# Patient Record
Sex: Female | Born: 1975
Health system: Southern US, Community
[De-identification: ages and names within clinical notes are randomized; demographics above are authoritative.]

## PROBLEM LIST (undated history)

## (undated) DIAGNOSIS — I214 Non-ST elevation (NSTEMI) myocardial infarction: Secondary | ICD-10-CM

## (undated) DIAGNOSIS — F419 Anxiety disorder, unspecified: Secondary | ICD-10-CM

## (undated) DIAGNOSIS — E785 Hyperlipidemia, unspecified: Secondary | ICD-10-CM

## (undated) DIAGNOSIS — I2510 Atherosclerotic heart disease of native coronary artery without angina pectoris: Secondary | ICD-10-CM

## (undated) DIAGNOSIS — T7840XA Allergy, unspecified, initial encounter: Secondary | ICD-10-CM

## (undated) DIAGNOSIS — F32A Depression, unspecified: Secondary | ICD-10-CM

## (undated) DIAGNOSIS — D751 Secondary polycythemia: Secondary | ICD-10-CM

## (undated) DIAGNOSIS — Z720 Tobacco use: Secondary | ICD-10-CM

## (undated) HISTORY — DX: Depression, unspecified: F32.A

## (undated) HISTORY — DX: Hyperlipidemia, unspecified: E78.5

## (undated) HISTORY — DX: Non-ST elevation (NSTEMI) myocardial infarction: I21.4

## (undated) HISTORY — DX: Anxiety disorder, unspecified: F41.9

## (undated) HISTORY — PX: ABDOMINAL HYSTERECTOMY: SHX81

## (undated) HISTORY — DX: Atherosclerotic heart disease of native coronary artery without angina pectoris: I25.10

## (undated) HISTORY — PX: BOTOX INJECTION: SHX5754

## (undated) HISTORY — DX: Allergy, unspecified, initial encounter: T78.40XA

## (undated) HISTORY — PX: OTHER SURGICAL HISTORY: SHX169

---

## 2004-10-24 ENCOUNTER — Emergency Department: Payer: Self-pay | Admitting: Emergency Medicine

## 2004-10-25 ENCOUNTER — Ambulatory Visit: Payer: Self-pay | Admitting: Emergency Medicine

## 2005-04-29 ENCOUNTER — Inpatient Hospital Stay: Payer: Self-pay | Admitting: Obstetrics and Gynecology

## 2005-05-11 ENCOUNTER — Ambulatory Visit: Payer: Self-pay | Admitting: Obstetrics and Gynecology

## 2010-06-03 ENCOUNTER — Ambulatory Visit: Payer: Self-pay

## 2010-06-28 ENCOUNTER — Ambulatory Visit: Payer: Self-pay | Admitting: Obstetrics & Gynecology

## 2010-07-06 ENCOUNTER — Ambulatory Visit: Payer: Self-pay | Admitting: Obstetrics & Gynecology

## 2011-07-21 ENCOUNTER — Ambulatory Visit: Payer: Self-pay

## 2012-09-19 ENCOUNTER — Emergency Department: Payer: Self-pay | Admitting: Emergency Medicine

## 2012-09-19 LAB — COMPREHENSIVE METABOLIC PANEL
Albumin: 4.2 g/dL (ref 3.4–5.0)
BUN: 10 mg/dL (ref 7–18)
Bilirubin,Total: 0.2 mg/dL (ref 0.2–1.0)
Calcium, Total: 8.7 mg/dL (ref 8.5–10.1)
Chloride: 104 mmol/L (ref 98–107)
Creatinine: 0.5 mg/dL — ABNORMAL LOW (ref 0.60–1.30)
EGFR (Non-African Amer.): >60
Glucose: 89 mg/dL (ref 65–99)
Potassium: 3.9 mmol/L (ref 3.5–5.1)
SGOT(AST): 92 U/L — ABNORMAL HIGH (ref 15–37)
SGPT (ALT): 74 U/L (ref 12–78)
Sodium: 133 mmol/L — ABNORMAL LOW (ref 136–145)
Total Protein: 8 g/dL (ref 6.4–8.2)

## 2012-09-19 LAB — URINALYSIS, COMPLETE
Bacteria: NONE SEEN
Blood: NEGATIVE
Glucose,UR: NEGATIVE mg/dL (ref 0–75)
Leukocyte Esterase: NEGATIVE
Nitrite: NEGATIVE
RBC,UR: NONE SEEN /HPF (ref 0–5)
Specific Gravity: 1.003 (ref 1.003–1.030)
Squamous Epithelial: NONE SEEN
WBC UR: <1 /HPF (ref 0–5)

## 2012-09-19 LAB — DRUG SCREEN, URINE
Amphetamines, Ur Screen: NEGATIVE (ref ?–1000)
Benzodiazepine, Ur Scrn: NEGATIVE (ref ?–200)
Cannabinoid 50 Ng, Ur ~~LOC~~: NEGATIVE (ref ?–50)
Cocaine Metabolite,Ur ~~LOC~~: NEGATIVE (ref ?–300)
MDMA (Ecstasy)Ur Screen: NEGATIVE (ref ?–500)
Methadone, Ur Screen: NEGATIVE (ref ?–300)
Opiate, Ur Screen: NEGATIVE (ref ?–300)
Tricyclic, Ur Screen: NEGATIVE (ref ?–1000)

## 2012-09-19 LAB — CBC
HCT: 45.5 % (ref 35.0–47.0)
HGB: 15.8 g/dL (ref 12.0–16.0)
MCH: 33.3 pg (ref 26.0–34.0)
Platelet: 292 10*3/uL (ref 150–440)
RDW: 12.6 % (ref 11.5–14.5)

## 2012-09-19 LAB — ETHANOL
Ethanol %: 0.318 % (ref 0.000–0.080)
Ethanol: 318 mg/dL

## 2012-09-19 LAB — PREGNANCY, URINE: Pregnancy Test, Urine: NEGATIVE m[IU]/mL

## 2012-09-20 LAB — ETHANOL: Ethanol: <3 mg/dL

## 2013-11-26 IMAGING — CR DG CHEST 2V
1 series · 2 of 2 positions shown · non-contrast
Comparison: none

REASON FOR EXAM: Rib pain left side, cough and chest discomfort
COMMENTS:

PROCEDURE:     MDR - MDR CHEST PA(OR AP) AND LATERAL  - July 21, 2011 [DATE]
RESULT:     The lung fields are clear. The heart, mediastinal and osseous
structures show no significant abnormalities. Particular attention to the
left ribs shows no definite rib fracture.

[Series 1: pa · 0.17mm/px · 2 of 2 slices shown]
[im 1/2]
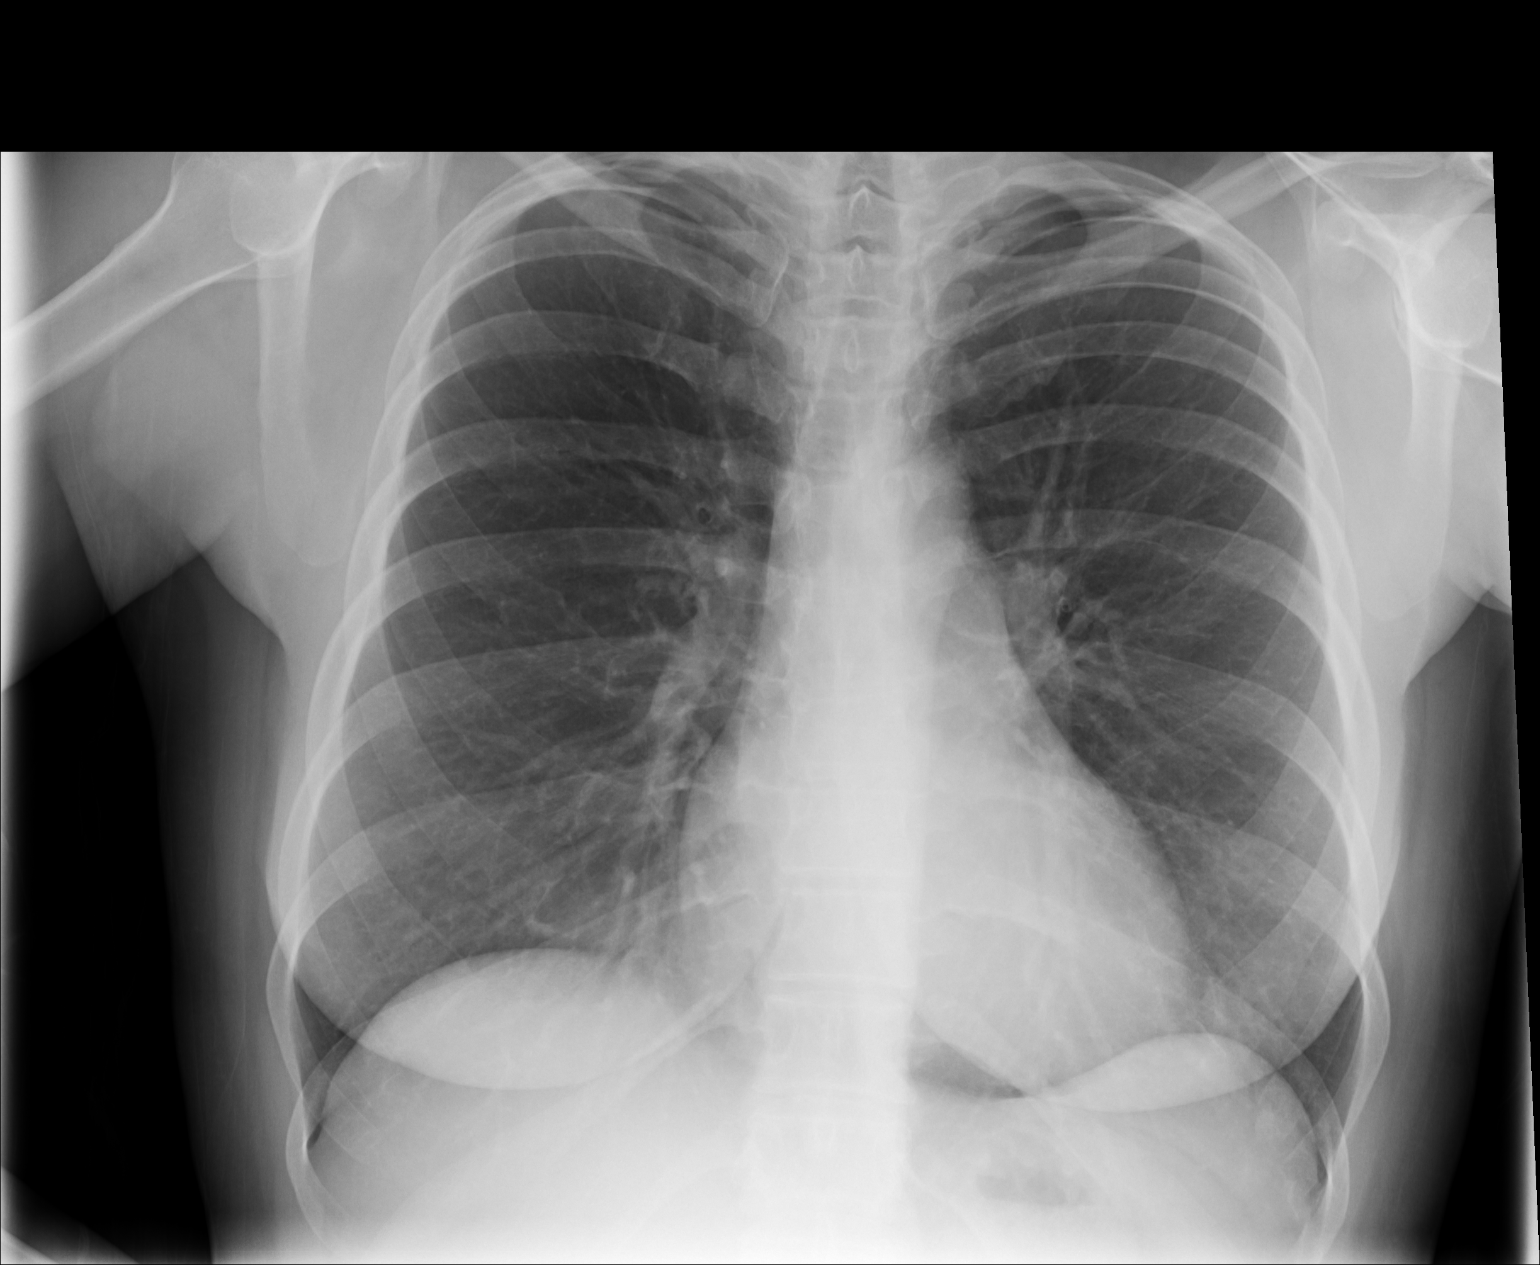
[im 2/2]
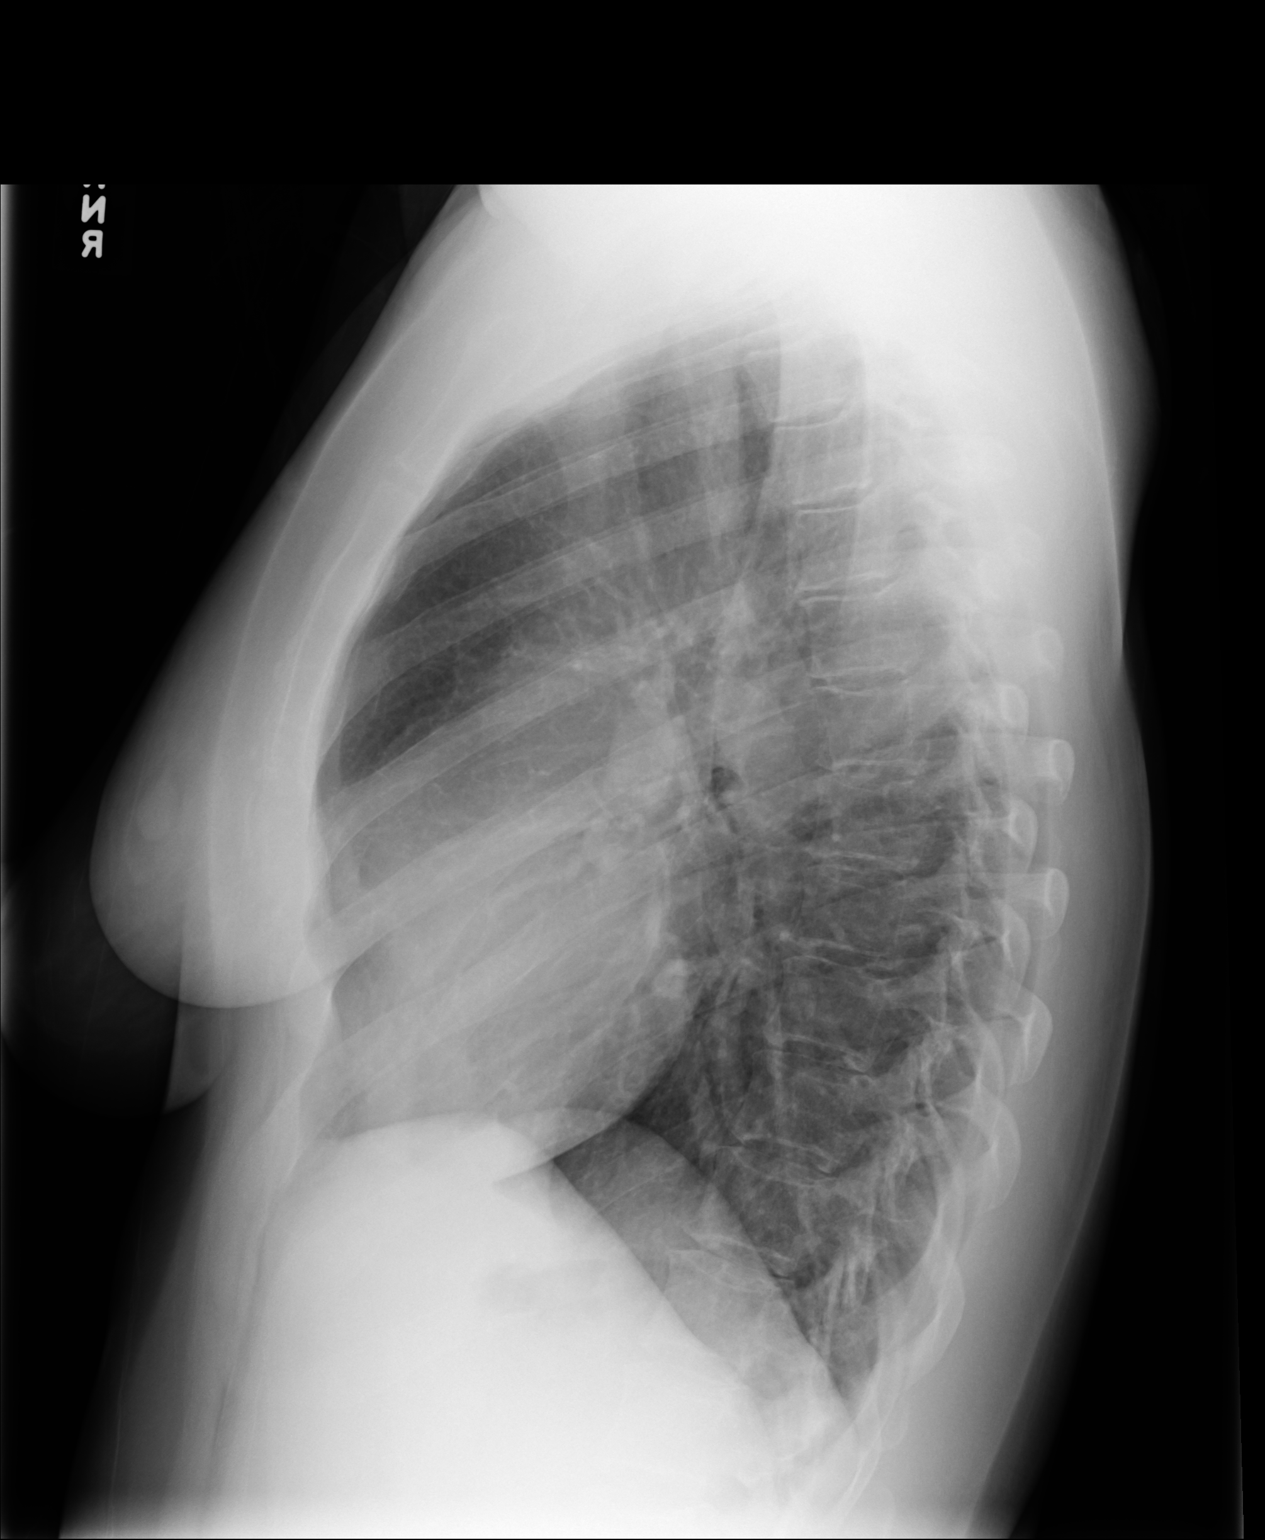

[2 of 2 positions shown; findings below may reference images not displayed]

IMPRESSION: 1.     No acute changes are identified.

## 2014-09-12 NOTE — Consult Note (Signed)
PATIENT NAME:  Anna Cameron, ORD MR#:  [id number] DATE OF BIRTH:  Apr 08, 1976  DATE OF CONSULTATION:  09/20/2012  REFERRING PHYSICIAN: Doralee Albino. Ladona Ridgel, MD  CONSULTING PHYSICIAN:  Ardeen Fillers. Garnetta Buddy, MD  REASON FOR CONSULTATION: "I'm tired of it."   HISTORY OF PRESENT ILLNESS: The patient is a 39 year old single Caucasian female who currently lives with her boyfriend, who presented to the ED requesting detox. She stated that she has a long history of drinking alcohol for the past several years. She stated that she has been consuming approximately 12-pack per day. Reported that she has been drinking steadily for the past 1 year. She came to the Emergency Room yesterday. She currently lives with her boyfriend and 2 children, ages 83 and 9 years old. She stated that she has been tired of drinking, and it has been helping her for a while, but now the stressors are becoming heavy at home as both she and her boyfriend have been drinking consistently. She reported that she does not have any history of withdrawal symptoms, and she has never been to a detox program. She decided to seek help at this time. The patient reported that she is not feeling depressed or anxious and does not have any suicidal ideations or plans. She reported that she wants to get help for her alcohol use at this time. She reported that after she consumes alcohol she can function well, and even before coming here, she made spaghetti for her children. She reported that she has been using 20 mg of Prozac to help with "sadness" on a daily basis. She reported that she has been sleeping well and does not have any problems with her appetite. The patient appeared motivated at this time.   PAST PSYCHIATRIC HISTORY: The patient reported that she has never been admitted to a psychiatric hospital. She denied any history of previous suicide attempts. Reported that she only takes Prozac for her depressive symptoms. She denied having any perceptual disturbances.    SUBSTANCE ABUSE HISTORY: The patient has a long history of using alcohol. Reported that she was using alcohol off and on for the past 2 years, but in the past 1 year, she started using it on a steady basis. She uses approximately 12-pack on a daily basis. Her current alcohol level is more than 300. She currently denies using other illicit drugs, but smokes cigarettes 1-1/2 packs per day.   FAMILY HISTORY: The patient denied any family history of use of drugs or alcohol.   PAST MEDICAL HISTORY:  1. Partial hysterectomy.  2. History of bladder surgery.  3. History of previous cesarean sections.   ALLERGIES: No known drug allergies.   CURRENT MEDICATIONS: Prozac 20 mg daily.   SOCIAL HISTORY: The patient reported that she currently lives with her boyfriend for the past 2 years. She has 2 children, ages 31 and 15 years old. 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 She denied any pending legal charges.   ANCILLARY DATA: Laboratory data: Glucose 89, BUN 10, creatinine 0.50, sodium 133, potassium 3.9, chloride 104, bicarbonate 18, GFR 60, anion gap 11, osmolality 265, calcium 8.7. Blood alcohol level 318. Protein 8.0, albumin 4.2, bilirubin 0.2, alkaline phosphatase 125, AST 92, ALT 74. Urine drug screen was negative. WBC 18.9, RBC 4.75, hemoglobin 15.8, hematocrit 45.5, platelet count 292, MCV 96, MCH 32.3, RDW 12.6. Salicylate level 14.6. Pregnancy test negative.   VITAL SIGNS: Temperature 98.3, pulse 70, respirations 18, blood pressure 120/62.   REVIEW OF SYSTEMS:  CONSTITUTIONAL: Denies having any fever or chills. No weight changes.  EYES: No double or blurred vision.  RESPIRATORY: No shortness of breath or cough.  CARDIOVASCULAR: No chest pain or orthopnea.  GASTROINTESTINAL: No abdominal pain, nausea, vomiting or diarrhea.  GENITOURINARY: No incontinence or frequency.  ENDOCRINE: No heat or cold intolerance.  LYMPHATIC: No anemia or easy bruising.  INTEGUMENTARY: No acne or rash.   MUSCULOSKELETAL: Denies having any muscle or joint pain.  NEUROLOGIC: No tingling or weakness noted.   MENTAL STATUS EXAMINATION: The patient is a moderately built female who appeared calm and cooperative. She maintained fair eye contact. Her mood was fine. Affect was congruent. Thought process was logical and goal-directed. Thought content was non-delusional. She currently denied having any suicidal ideation or plan. No impulsive behavior was noted. She was minimizing her use of alcohol at this time. She denied having any withdrawal symptoms.   DIAGNOSTIC IMPRESSION:  AXIS I:  1. Alcohol dependence.  2. Alcohol-induced mood disorder.  AXIS II: None.  AXIS III: Please review the medical history.   TREATMENT PLAN: I discussed with the patient at length about the alcohol rehabilitation programs, and she agreed to go to the [facility], which will provide her with 3 days at least of inpatient rehabilitation, and then outpatient rehab.[facility]. She will continue on her Prozac 20 mg daily at this time. The patient will be discharged in stable condition. She denied having any suicidal or homicidal ideations or plans. No new medications will be prescribed at this time.   Thank you for allowing me to participate in the care of this patient.   ____________________________ Ardeen FillersUzma S. Garnetta BuddyFaheem, MD usf:OSi D: 09/20/2012 12:57:44 ET T: 09/20/2012 13:25:28 ET JOB#: [id number]  cc: Ardeen FillersUzma S. Garnetta BuddyFaheem, MD, <Dictator> Rhunette CroftUZMA S Huzaifa Viney MD ELECTRONICALLY SIGNED 09/28/2012 8:59

## 2016-09-22 DIAGNOSIS — R634 Abnormal weight loss: Secondary | ICD-10-CM | POA: Insufficient documentation

## 2019-02-06 DIAGNOSIS — N3281 Overactive bladder: Secondary | ICD-10-CM | POA: Diagnosis not present

## 2019-02-06 DIAGNOSIS — N3941 Urge incontinence: Secondary | ICD-10-CM | POA: Diagnosis not present

## 2019-02-06 DIAGNOSIS — R3915 Urgency of urination: Secondary | ICD-10-CM | POA: Diagnosis not present

## 2019-03-06 DIAGNOSIS — N3281 Overactive bladder: Secondary | ICD-10-CM | POA: Diagnosis not present

## 2019-03-06 DIAGNOSIS — R3915 Urgency of urination: Secondary | ICD-10-CM | POA: Diagnosis not present

## 2019-03-06 DIAGNOSIS — N3941 Urge incontinence: Secondary | ICD-10-CM | POA: Diagnosis not present

## 2019-05-11 DIAGNOSIS — Z20828 Contact with and (suspected) exposure to other viral communicable diseases: Secondary | ICD-10-CM | POA: Diagnosis not present

## 2019-06-07 DIAGNOSIS — Z20828 Contact with and (suspected) exposure to other viral communicable diseases: Secondary | ICD-10-CM | POA: Diagnosis not present

## 2019-06-25 DIAGNOSIS — Z20828 Contact with and (suspected) exposure to other viral communicable diseases: Secondary | ICD-10-CM | POA: Diagnosis not present

## 2019-07-03 DIAGNOSIS — N3281 Overactive bladder: Secondary | ICD-10-CM | POA: Diagnosis not present

## 2019-07-03 DIAGNOSIS — N399 Disorder of urinary system, unspecified: Secondary | ICD-10-CM | POA: Diagnosis not present

## 2019-08-26 ENCOUNTER — Encounter
Admission: RE | Admit: 2019-08-26 | Discharge: 2019-08-26 | Disposition: A | Discharge status: Home / Self Care | Payer: 59 | Source: Ambulatory Visit | Attending: Urology | Admitting: Urology

## 2019-08-26 ENCOUNTER — Other Ambulatory Visit: Payer: Self-pay

## 2019-08-26 NOTE — Patient Instructions (Signed)
  Your procedure is scheduled on: Thursday September 05, 2019 Report to Day Surgery. To find out your arrival time please call [phone] between 1PM - 3PM on Wednesday September 04, 2019.  Remember: Instructions that are not followed completely may result in serious medical risk,  up to and including death, or upon the discretion of your surgeon and anesthesiologist your  surgery may need to be rescheduled.     _X__ 1. Do not eat food after midnight the night before your procedure.                 No gum chewing or hard candies. You may drink clear liquids up to 2 hours                 before you are scheduled to arrive for your surgery- DO not drink clear                 liquids within 2 hours of the start of your surgery.                 Clear Liquids include:  water, apple juice without pulp, clear Gatorade, G2 or                  Gatorade Zero (avoid Red/Purple/Blue), Black Coffee or Tea (Do not add                 anything to coffee or tea).  __X__2.  On the morning of surgery brush your teeth with toothpaste and water, you                may rinse your mouth with mouthwash if you wish.  Do not swallow any toothpaste of mouthwash.     _X__ 3.  No Alcohol for 24 hours before or after surgery.   _X__ 4.  Do Not Smoke or use e-cigarettes For 24 Hours Prior to Your Surgery.                 Do not use any chewable tobacco products for at least 6 hours prior to                 surgery.   __x__  5.  Notify your doctor if there is any change in your medical condition      (cold, fever, infections).     Do not wear jewelry, make-up, hairpins, clips or nail polish. Do not wear lotions, powders, or perfumes. You may wear deodorant. Do not shave 48 hours prior to surgery. Men may shave face and neck. Do not bring valuables to the hospital.    [facility] is not responsible for any belongings or valuables.  Contacts, dentures or bridgework may not be worn into  surgery. Leave your suitcase in the car. After surgery it may be brought to your room. For patients admitted to the hospital, discharge time is determined by your treatment team.   Patients discharged the day of surgery will not be allowed to drive home.   Make arrangements for someone to be with you for the first 24 hours of your Same Day Discharge.  ____ Take these medicines the morning of surgery with A SIP OF WATER:    1.none   __x__ Stop Anti-inflammatories on ibuprofen, Aleve, naproxen, aspirin and or BC powders.    __x__ Stop supplements until after surgery.    __x__ Do not start any herbal supplements before your surgery.

## 2019-08-30 NOTE — H&P (Signed)
NAME: Anna Cameron, Anna Cameron MEDICAL RECORD YT:[id number] ACCOUNT [id number] DATE OF BIRTH:10/16/1975 FACILITY: [facility] LOCATION:  PHYSICIAN:Faria Casella Gilles Chiquito, MD  HISTORY AND PHYSICAL  DATE OF ADMISSION:  09/05/2019  CHIEF COMPLAINT:  Urinary urgency and urge incontinence.  HISTORY OF PRESENT ILLNESS:  The patient is a 44 year old white female with overactive bladder and urge incontinence.  She failed treatment with Myrbetriq 50 mg and Toviaz 8 mg.  Overactive bladder symptom score was 30.  She comes in now for bladder  Botox injection.  ALLERGIES:  No drug allergies.  CURRENT MEDICATIONS:  Include multivitamins and vitamin B12.  PAST SURGICAL HISTORY:  C-sections, 2005, 2006 and 1998.  She had a partial hysterectomy 2008.  She had an anterior vaginal repair in 2002.  PAST AND CURRENT MEDICAL CONDITIONS:  Negative.  REVIEW OF SYSTEMS:  The patient has a history of tension headaches.  She denies chest pain, heart disease, shortness of breath, diabetes, stroke or hypertension.  SOCIAL HISTORY:  The patient smokes a pack and a half a day and has a 30-pack-year history.  She denied alcohol use.  FAMILY HISTORY:  Father died at age 2 of pancreatic cancer.  Mother is living, age 67 without any significant health problems.  PHYSICAL EXAMINATION: VITAL SIGNS:  Height 5 feet 6 inches, weight 170, BMI 27. GENERAL:  Well-nourished, white female in no acute distress. HEENT:  Sclerae were clear.  Pupils are equally round, reactive to light and accommodation.  Extraocular movements are intact. NECK:  No palpable masses or tenderness.  Thyroid gland was smooth without palpable nodules.  No audible carotid bruits. LYMPHATIC:  No palpable inguinal or cervical adenopathy. PULMONARY:  Lungs clear to auscultation. CARDIOVASCULAR:  Regular rhythm and rate without audible murmurs. ABDOMEN:  Soft, nontender abdomen.  No CVA tenderness. GENITOURINARY:  Revealed normal external genitalia.  She had good  anterior support with negative Marshall test.  Urethral meatus was normal.  No vaginal discharge. RECTAL:  No palpable rectal masses.  Good anal sphincter tone. NEUROMUSCULAR:  Grossly intact.  Alert and oriented x3.  IMPRESSION:  Overactive bladder with urge incontinence.  PLAN:  Bladder Botox injections.  JN/NUANCE  D:08/30/2019 T:08/30/2019 JOB:[id number]

## 2019-09-03 ENCOUNTER — Other Ambulatory Visit: Payer: Self-pay

## 2019-09-03 ENCOUNTER — Other Ambulatory Visit
Admission: RE | Admit: 2019-09-03 | Discharge: 2019-09-03 | Disposition: A | Discharge status: Home / Self Care | Payer: 59 | Source: Ambulatory Visit | Attending: Urology | Admitting: Urology

## 2019-09-03 DIAGNOSIS — Z20822 Contact with and (suspected) exposure to covid-19: Secondary | ICD-10-CM | POA: Diagnosis not present

## 2019-09-03 DIAGNOSIS — Z01812 Encounter for preprocedural laboratory examination: Secondary | ICD-10-CM | POA: Insufficient documentation

## 2019-09-03 LAB — SARS CORONAVIRUS 2 (TAT 6-24 HRS): SARS Coronavirus 2: NEGATIVE

## 2019-09-05 ENCOUNTER — Ambulatory Visit: Payer: 59 | Admitting: Anesthesiology

## 2019-09-05 ENCOUNTER — Ambulatory Visit
Admission: RE | Admit: 2019-09-05 | Discharge: 2019-09-05 | Disposition: A | Discharge status: Home / Self Care | Payer: 59 | Source: Ambulatory Visit | Attending: Urology | Admitting: Urology

## 2019-09-05 ENCOUNTER — Encounter
Admission: RE | Disposition: A | Discharge status: Home / Self Care | Payer: Self-pay | Source: Ambulatory Visit | Attending: Urology

## 2019-09-05 ENCOUNTER — Other Ambulatory Visit: Payer: Self-pay

## 2019-09-05 ENCOUNTER — Encounter: Payer: Self-pay | Admitting: Urology

## 2019-09-05 DIAGNOSIS — N3281 Overactive bladder: Secondary | ICD-10-CM | POA: Insufficient documentation

## 2019-09-05 DIAGNOSIS — N3941 Urge incontinence: Secondary | ICD-10-CM | POA: Diagnosis not present

## 2019-09-05 DIAGNOSIS — F17210 Nicotine dependence, cigarettes, uncomplicated: Secondary | ICD-10-CM | POA: Diagnosis not present

## 2019-09-05 HISTORY — PX: CYSTOSCOPY WITH INJECTION: SHX1424

## 2019-09-05 SURGERY — CYSTOSCOPY, WITH INJECTION OF BLADDER NECK OR BLADDER WALL
Anesthesia: General

## 2019-09-05 MED ORDER — FAMOTIDINE 20 MG PO TABS
ORAL_TABLET | ORAL | Status: AC
  Administered 2019-09-05: 20 mg via ORAL
  Filled 2019-09-05: qty 1

## 2019-09-05 MED ORDER — BELLADONNA ALKALOIDS-OPIUM 16.2-60 MG RE SUPP
RECTAL | Status: AC
  Filled 2019-09-05: qty 1

## 2019-09-05 MED ORDER — FENTANYL CITRATE (PF) 100 MCG/2ML IJ SOLN: 25.0000 ug | INTRAMUSCULAR | Status: DC | PRN

## 2019-09-05 MED ORDER — FAMOTIDINE 20 MG PO TABS: 20.0000 mg | ORAL_TABLET | Freq: Once | ORAL | Status: AC

## 2019-09-05 MED ORDER — CIPROFLOXACIN HCL 500 MG PO TABS: 500.0000 mg | ORAL_TABLET | Freq: Two times a day (BID) | ORAL | 0 refills | Status: DC

## 2019-09-05 MED ORDER — LACTATED RINGERS IV SOLN: INTRAVENOUS | Status: DC

## 2019-09-05 MED ORDER — LIDOCAINE HCL URETHRAL/MUCOSAL 2 % EX GEL
CUTANEOUS | Status: AC
  Filled 2019-09-05: qty 10

## 2019-09-05 MED ORDER — ONDANSETRON HCL 4 MG/2ML IJ SOLN: 4.0000 mg | Freq: Once | INTRAMUSCULAR | Status: DC | PRN

## 2019-09-05 MED ORDER — PROPOFOL 10 MG/ML IV BOLUS
INTRAVENOUS | Status: AC
  Filled 2019-09-05: qty 20

## 2019-09-05 MED ORDER — CEFAZOLIN SODIUM-DEXTROSE 1-4 GM/50ML-% IV SOLN
INTRAVENOUS | Status: AC
  Filled 2019-09-05: qty 50

## 2019-09-05 MED ORDER — MIDAZOLAM HCL 2 MG/2ML IJ SOLN
INTRAMUSCULAR | Status: DC | PRN
  Administered 2019-09-05: 2 mg via INTRAVENOUS

## 2019-09-05 MED ORDER — URO-MP 118 MG PO CAPS: 1.0000 | ORAL_CAPSULE | Freq: Four times a day (QID) | ORAL | 1 refills | Status: DC | PRN

## 2019-09-05 MED ORDER — FENTANYL CITRATE (PF) 100 MCG/2ML IJ SOLN
INTRAMUSCULAR | Status: DC | PRN
  Administered 2019-09-05 (×2): 50 ug via INTRAVENOUS

## 2019-09-05 MED ORDER — CEFAZOLIN SODIUM-DEXTROSE 1-4 GM/50ML-% IV SOLN
1.0000 g | Freq: Once | INTRAVENOUS | Status: AC
  Administered 2019-09-05: 08:00:00 1 g via INTRAVENOUS

## 2019-09-05 MED ORDER — MIDAZOLAM HCL 2 MG/2ML IJ SOLN
INTRAMUSCULAR | Status: AC
  Filled 2019-09-05: qty 2

## 2019-09-05 MED ORDER — OXYCODONE HCL 5 MG PO TABS: 5.0000 mg | ORAL_TABLET | Freq: Once | ORAL | Status: DC | PRN

## 2019-09-05 MED ORDER — PROPOFOL 500 MG/50ML IV EMUL
INTRAVENOUS | Status: DC | PRN
  Administered 2019-09-05: 100 ug/kg/min via INTRAVENOUS

## 2019-09-05 MED ORDER — SODIUM CHLORIDE (PF) 0.9 % IJ SOLN
INTRAMUSCULAR | Status: AC
  Filled 2019-09-05: qty 50

## 2019-09-05 MED ORDER — ACETAMINOPHEN 10 MG/ML IV SOLN: 1000.0000 mg | Freq: Once | INTRAVENOUS | Status: DC | PRN

## 2019-09-05 MED ORDER — FENTANYL CITRATE (PF) 100 MCG/2ML IJ SOLN
INTRAMUSCULAR | Status: AC
  Filled 2019-09-05: qty 2

## 2019-09-05 MED ORDER — SODIUM CHLORIDE FLUSH 0.9 % IV SOLN
INTRAVENOUS | Status: AC
  Filled 2019-09-05: qty 10

## 2019-09-05 MED ORDER — OXYCODONE HCL 5 MG/5ML PO SOLN: 5.0000 mg | Freq: Once | ORAL | Status: DC | PRN

## 2019-09-05 SURGICAL SUPPLY — 18 items
BAG DRAIN CYSTO-URO LG1000N (MISCELLANEOUS) ×2 IMPLANT
COVER WAND RF STERILE (DRAPES) ×1 IMPLANT
GLOVE BIO SURGEON STRL SZ7.5 (GLOVE) ×4 IMPLANT
GOWN STRL REUS W/ TWL LRG LVL3 (GOWN DISPOSABLE) ×1 IMPLANT
GOWN STRL REUS W/ TWL XL LVL3 (GOWN DISPOSABLE) ×1 IMPLANT
GOWN STRL REUS W/TWL LRG LVL3 (GOWN DISPOSABLE) ×1
GOWN STRL REUS W/TWL XL LVL3 (GOWN DISPOSABLE) ×1
KIT TURNOVER CYSTO (KITS) ×2 IMPLANT
NDL INJETAK FLEX 70CM BOTOX (NEEDLE) ×1 IMPLANT
NDL SAFETY ECLIPSE 18X1.5 (NEEDLE) ×2 IMPLANT
NEEDLE HYPO 18GX1.5 SHARP (NEEDLE) ×2
NEEDLE INJETAK FLEX 70CM BOTOX (NEEDLE) ×2 IMPLANT
PACK CYSTO AR (MISCELLANEOUS) ×2 IMPLANT
SET CYSTO W/LG BORE CLAMP LF (SET/KITS/TRAYS/PACK) ×2 IMPLANT
SURGILUBE 2OZ TUBE FLIPTOP (MISCELLANEOUS) ×2 IMPLANT
SYR 10ML LL (SYRINGE) ×6 IMPLANT
WATER STERILE IRR 1000ML POUR (IV SOLUTION) ×1 IMPLANT
WATER STERILE IRR 3000ML UROMA (IV SOLUTION) ×2 IMPLANT

## 2019-09-05 NOTE — Discharge Instructions (Addendum)
Botulinum Toxin Bladder Injection  A botulinum toxin bladder injection is a procedure to treat an overactive bladder. During the procedure, a drug called botulinum toxin is injected into the bladder through a long, thin needle. This drug relaxes the bladder muscles and reduces overactivity. You may need this procedure if your medicines are not working or you cannot take them. The procedure may be repeated as needed. The treatment usually lasts for 6 months. Your health care provider will monitor you to see how well you respond. Tell a health care provider about:  Any allergies you have.  All medicines you are taking, including vitamins, herbs, eye drops, creams, and over-the-counter medicines.  Any problems you or family members have had with anesthetic medicines.  Any blood disorders you have.  Any surgeries you have had.  Any medical conditions you have.  Any previous reactions to a botulinum toxin injection.  Any symptoms of urinary tract infection. These include chills, fever, a burning feeling when passing urine, and needing to pass urine often.  Whether you are pregnant or may be pregnant. What are the risks? Generally this is a safe procedure. However, problems may occur, including:  Not being able to pass urine. If this happens, you may need to have your bladder emptied with a thin tube inserted into your urethra (urinary catheter).  Bleeding.  Urinary tract infection.  Allergic reaction to the botulinum toxin.  Pain or burning when passing urine.  Damage to other structures or organs. What happens before the procedure? Staying hydrated Follow instructions from your health care provider about hydration, which may include:  Up to 2 hours before the procedure - you may continue to drink clear liquids, such as water, clear fruit juice, black coffee, and plain tea. Eating and drinking restrictions Follow instructions from your health care provider about eating and  drinking, which may include:  8 hours before the procedure - stop eating heavy meals or foods, such as meat, fried foods, or fatty foods.  6 hours before the procedure - stop eating light meals or foods, such as toast or cereal.  6 hours before the procedure - stop drinking milk or drinks that contain milk.  2 hours before the procedure - stop drinking clear liquids. Medicines Ask your health care provider about:  Changing or stopping your regular medicines. This is especially important if you are taking diabetes medicines or blood thinners.  Taking medicines such as aspirin and ibuprofen. These medicines can thin your blood. Do not take these medicines unless your health care provider tells you to take them.  Taking over-the-counter medicines, vitamins, herbs, and supplements. General instructions  Plan to have someone take you home from the hospital or clinic.  If you will be going home right after the procedure, plan to have someone with you for 24 hours.  Ask your health care provider what steps will be taken to help prevent infection. These may include: ? Removing hair at the procedure site. ? Washing skin with a germ-killing soap. ? Antibiotic medicine. What happens during the procedure?   You will be asked to empty your bladder.  An IV will be inserted into one of your veins.  You will be given one or more of the following: ? A medicine to help you relax (sedative). ? A medicine to numb the area (local anesthetic). ? A medicine to make you fall asleep (general anesthetic).  A long, thin scope called a cystoscope will be passed into your bladder through the part   of the body that carries urine from your bladder (urethra).  The cystoscope will be used to fill your bladder with water.  A long needle will be passed through the cystoscope and into the bladder.  The botulinum toxin will be injected into your bladder. It may be injected into multiple areas of your  bladder.  Your bladder will be emptied, and the cystoscope will be removed. The procedure may vary among health care providers and hospitals. What can I expect after procedure? After your procedure, it is common to have:  Blood-tinged urine.  Burning or soreness when you pass urine. Follow these instructions at home: Medicines  Take over-the-counter and prescription medicines only as told by your health care provider.  If you were prescribed an antibiotic medicine, take it as told by your health care provider. Do not stop taking the antibiotic even if you start to feel better. General instructions   Do not drive for 24 hours if you were given a sedative during your procedure.  Drink enough fluid to keep your urine pale yellow.  Return to your normal activities as told by your health care provider. Ask your health care provider what activities are safe for you.  Keep all follow-up visits as told by your health care provider. This is important. Contact a health care provider if you have:  A fever or chills.  Blood-tinged urine for more than one day after your procedure.  Worsening pain or burning when you pass urine.  Pain or burning when passing urine for more than two days after your procedure.  Trouble emptying your bladder. Get help right away if you:  Have bright red blood in your urine.  Are unable to pass urine. Summary  A botulinum toxin bladder injection is a procedure to treat an overactive bladder.  This is generally a very safe procedure. However, problems may occur, including not being able to pass urine, bleeding, infection, pain, and allergic reactions to medicines.  You will be told when to stop eating and drinking, and what medicines to change or stop. Follow instructions carefully.  After the procedure, it is common to have blood in urine and to have soreness or burning when passing urine.  Contact a health care provider if you have a fever, have  blood in urine for more than a few days, or have trouble passing urine. Get help right away if you have bright red blood in the urine, or if you are unable to pass urine. This information is not intended to replace advice given to you by your health care provider. Make sure you discuss any questions you have with your health care provider. Document Revised: 11/17/2017 Document Reviewed: 11/17/2017 Elsevier Patient Education  [place]. Overactive Bladder, Adult  Overactive bladder refers to a condition in which a person has a sudden need to pass urine. The person may leak urine if he or she cannot get to the bathroom fast enough (urinary incontinence). A person with this condition may also wake up several times in the night to go to the bathroom. Overactive bladder is associated with poor nerve signals between your bladder and your brain. Your bladder may get the signal to empty before it is full. You may also have very sensitive muscles that make your bladder squeeze too soon. These symptoms might interfere with daily work or social activities. What are the causes? This condition may be associated with or caused by:  Urinary tract infection.  Infection of nearby tissues, such  as the prostate.  Prostate enlargement.  Surgery on the uterus or urethra.  Bladder stones, inflammation, or tumors.  Drinking too much caffeine or alcohol.  Certain medicines, especially medicines that get rid of extra fluid in the body (diuretics).  Muscle or nerve weakness, especially from: ? A spinal cord injury. ? Stroke. ? Multiple sclerosis. ? Parkinson's disease.  Diabetes.  Constipation. What increases the risk? You may be at greater risk for overactive bladder if you:  Are an older adult.  Smoke.  Are going through menopause.  Have prostate problems.  Have a neurological disease, such as stroke, dementia, Parkinson's disease, or multiple sclerosis (MS).  Eat or drink things that  irritate the bladder. These include alcohol, spicy food, and caffeine.  Are overweight or obese. What are the signs or symptoms? Symptoms of this condition include:  Sudden, strong urge to urinate.  Leaking urine.  Urinating 8 or more times a day.  Waking up to urinate 2 or more times a night. How is this diagnosed? Your health care provider may suspect overactive bladder based on your symptoms. He or she will diagnose this condition by:  A physical exam and medical history.  Blood or urine tests. You might need bladder or urine tests to help determine what is causing your overactive bladder. You might also need to see a health care provider who specializes in urinary tract problems (urologist). How is this treated? Treatment for overactive bladder depends on the cause of your condition and whether it is mild or severe. You can also make lifestyle changes at home. Options include:  Bladder training. This may include: ? Learning to control the urge to urinate by following a schedule that directs you to urinate at regular intervals (timed voiding). ? Doing Kegel exercises to strengthen your pelvic floor muscles, which support your bladder. Toning these muscles can help you control urination, even if your bladder muscles are overactive.  Special devices. This may include: ? Biofeedback, which uses sensors to help you become aware of your body's signals. ? Electrical stimulation, which uses electrodes placed inside the body (implanted) or outside the body. These electrodes send gentle pulses of electricity to strengthen the nerves or muscles that control the bladder. ? Women may use a plastic device that fits into the vagina and supports the bladder (pessary).  Medicines. ? Antibiotics to treat bladder infection. ? Antispasmodics to stop the bladder from releasing urine at the wrong time. ? Tricyclic antidepressants to relax bladder muscles. ? Injections of botulinum toxin type A  directly into the bladder tissue to relax bladder muscles.  Lifestyle changes. This may include: ? Weight loss. Talk to your health care provider about weight loss methods that would work best for you. ? Diet changes. This may include reducing how much alcohol and caffeine you consume, or drinking fluids at different times of the day. ? Not smoking. Do not use any products that contain nicotine or tobacco, such as cigarettes and e-cigarettes. If you need help quitting, ask your health care provider.  Surgery. ? A device may be implanted to help manage the nerve signals that control urination. ? An electrode may be implanted to stimulate electrical signals in the bladder. ? A procedure may be done to change the shape of the bladder. This is done only in very severe cases. Follow these instructions at home: Lifestyle  Make any diet or lifestyle changes that are recommended by your health care provider. These may include: ? Drinking less fluid or  drinking fluids at different times of the day. ? Cutting down on caffeine or alcohol. ? Doing Kegel exercises. ? Losing weight if needed. ? Eating a healthy and balanced diet to prevent constipation. This may include:  Eating foods that are high in fiber, such as fresh fruits and vegetables, whole grains, and beans.  Limiting foods that are high in fat and processed sugars, such as fried and sweet foods. General instructions  Take over-the-counter and prescription medicines only as told by your health care provider.  If you were prescribed an antibiotic medicine, take it as told by your health care provider. Do not stop taking the antibiotic even if you start to feel better.  Use any implants or pessary as told by your health care provider.  If needed, wear pads to absorb urine leakage.  Keep a journal or log to track how much and when you drink and when you feel the need to urinate. This will help your health care provider monitor your  condition.  Keep all follow-up visits as told by your health care provider. This is important. Contact a health care provider if:  You have a fever.  Your symptoms do not get better with treatment.  Your pain and discomfort get worse.  You have more frequent urges to urinate. Get help right away if:  You are not able to control your bladder. Summary  Overactive bladder refers to a condition in which a person has a sudden need to pass urine.  Several conditions may lead to an overactive bladder.  Treatment for overactive bladder depends on the cause and severity of your condition.  Follow your health care provider's instructions about lifestyle changes, doing Kegel exercises, keeping a journal, and taking medicines. This information is not intended to replace advice given to you by your health care provider. Make sure you discuss any questions you have with your health care provider. Document Revised: 08/30/2018 Document Reviewed: 05/25/2017 Elsevier Patient Education  2020 [employer]   AMBULATORY SURGERY  DISCHARGE INSTRUCTIONS   1) The drugs that you were given will stay in your system until tomorrow so for the next 24 hours you should not:  A) Drive an automobile B) Make any legal decisions C) Drink any alcoholic beverage   2) You may resume regular meals tomorrow.  Today it is better to start with liquids and gradually work up to solid foods.  You may eat anything you prefer, but it is better to start with liquids, then soup and crackers, and gradually work up to solid foods.   3) Please notify your doctor immediately if you have any unusual bleeding, trouble breathing, redness and pain at the surgery site, drainage, fever, or pain not relieved by medication.    4) Additional Instructions:        Please contact your physician with any problems or Same Day Surgery at [phone], Monday through Friday 6 am to 4 pm, or [place] at [facility] number  at [phone].

## 2019-09-05 NOTE — Op Note (Signed)
Preoperative diagnosis: 1.  Overactive bladder (N32.81)                                             2.  Urge incontinence (N39.15)  Postoperative diagnosis: Same  Procedure: Cystoscopy with bladder Botox injection (CPT [phone])  Surgeon: Suszanne Conners. Evelene Croon MD  Anesthesia: General  Indications:See the history and physical. After informed consent the above procedure(s) were requested     Technique and findings: After adequate general anesthesia been obtained patient was placed into dorsal lithotomy position and the perineum was prepped and draped in the usual fashion.  21 French cystoscope was coupled to the camera and placed into the bladder.  Bladder was thoroughly inspected.  The ureteral orifices were identified and had clear efflux.  No bladder mucosal lesions were identified.  The flexible Botox injection needle was flushed and passed into the bladder via the cystoscope.  The needle depth was set at 2 mm.  20 systematic injections of 0.5 cc/inj. spaced 1 cm apart throughout the posterior bladder wall.  The bladder was then drained and cystoscope removed.  10 cc of viscous Xylocaine was instilled within the urethra and bladder.  A B&O suppository was placed.  Procedure was then terminated and patient transferred to recovery room in stable condition.

## 2019-09-05 NOTE — Anesthesia Postprocedure Evaluation (Signed)
Anesthesia Post Note  Patient: Anna Cameron  Procedure(s) Performed: CYSTOSCOPY WITH INJECTION BLADDER BOTOX (N/A )  Patient location during evaluation: PACU Anesthesia Type: General Level of consciousness: awake and alert Pain management: pain level controlled Vital Signs Assessment: post-procedure vital signs reviewed and stable Respiratory status: spontaneous breathing, nonlabored ventilation, respiratory function stable and patient connected to nasal cannula oxygen Cardiovascular status: blood pressure returned to baseline and stable Postop Assessment: no apparent nausea or vomiting Anesthetic complications: no     Last Vitals:  Vitals:   09/05/19 0812 09/05/19 0827  BP: 108/61 108/71  Pulse: 77 65  Resp: 10 19  Temp: (!) 36.2 C   SpO2: 100% 97%    Last Pain:  Vitals:   09/05/19 0827  PainSc: Asleep                 Corinda Gubler

## 2019-09-05 NOTE — Transfer of Care (Signed)
Immediate Anesthesia Transfer of Care Note  Patient: Anna Cameron  Procedure(s) Performed: CYSTOSCOPY WITH INJECTION BLADDER BOTOX (N/A )  Patient Location: PACU  Anesthesia Type:MAC  Level of Consciousness: awake and alert   Airway & Oxygen Therapy: Patient Spontanous Breathing  Post-op Assessment: Report given to RN  Post vital signs: Reviewed and stable  Last Vitals:  Vitals Value Taken Time  BP    Temp    Pulse 77 09/05/19 0811  Resp 10 09/05/19 0811  SpO2 100 % 09/05/19 0811  Vitals shown include unvalidated device data.  Last Pain:  Vitals:   09/05/19 0626  PainSc: 0-No pain         Complications: No apparent anesthesia complications

## 2019-09-05 NOTE — H&P (Signed)
Date of Initial H&P: 08/30/19  History reviewed, patient examined, no change in status, stable for surgery. 

## 2019-09-05 NOTE — Anesthesia Preprocedure Evaluation (Signed)
Anesthesia Evaluation  Patient identified by MRN, date of birth, ID band Patient awake    Reviewed: Allergy & Precautions, NPO status , Patient's Chart, lab work & pertinent test results  History of Anesthesia Complications Negative for: history of anesthetic complications  Airway Mallampati: I  TM Distance: >3 FB Neck ROM: Full    Dental no notable dental hx. (+) Teeth Intact, Dental Advisory Given   Pulmonary neg pulmonary ROS, neg sleep apnea, neg COPD, Current SmokerPatient did not abstain from smoking.,    Pulmonary exam normal breath sounds clear to auscultation       Cardiovascular Exercise Tolerance: Good METS(-) hypertension(-) CAD and (-) Past MI negative cardio ROS  (-) dysrhythmias  Rhythm:Regular Rate:Normal - Systolic murmurs    Neuro/Psych negative neurological ROS  negative psych ROS   GI/Hepatic neg GERD  ,(+)     (-) substance abuse  ,   Endo/Other  neg diabetes  Renal/GU negative Renal ROS     Musculoskeletal   Abdominal   Peds  Hematology   Anesthesia Other Findings History reviewed. No pertinent past medical history.  Reproductive/Obstetrics                             Anesthesia Physical Anesthesia Plan  ASA: II  Anesthesia Plan: General   Post-op Pain Management:    Induction: Intravenous  PONV Risk Score and Plan: 3 and Ondansetron, Propofol infusion, TIVA and Midazolam  Airway Management Planned: LMA and Natural Airway  Additional Equipment: None  Intra-op Plan:   Post-operative Plan:   Informed Consent: I have reviewed the patients History and Physical, chart, labs and discussed the procedure including the risks, benefits and alternatives for the proposed anesthesia with the patient or authorized representative who has indicated his/her understanding and acceptance.     Dental advisory given  Plan Discussed with: CRNA and  Surgeon  Anesthesia Plan Comments: (Discussed risks of anesthesia with patient, including possibility of difficulty with spontaneous ventilation under anesthesia necessitating airway intervention, PONV, and rare risks such as cardiac or respiratory or neurological events. Patient understands.)        Anesthesia Quick Evaluation

## 2019-09-11 DIAGNOSIS — N3281 Overactive bladder: Secondary | ICD-10-CM | POA: Diagnosis not present

## 2019-12-03 ENCOUNTER — Encounter: Payer: Self-pay | Admitting: Family Medicine

## 2019-12-03 ENCOUNTER — Ambulatory Visit (INDEPENDENT_AMBULATORY_CARE_PROVIDER_SITE_OTHER): Payer: 59 | Admitting: Family Medicine

## 2019-12-03 ENCOUNTER — Other Ambulatory Visit: Payer: Self-pay

## 2019-12-03 VITALS — BP 130/90 | HR 72 | Ht 66.5 in | Wt 176.0 lb

## 2019-12-03 DIAGNOSIS — R601 Generalized edema: Secondary | ICD-10-CM

## 2019-12-03 DIAGNOSIS — Z7689 Persons encountering health services in other specified circumstances: Secondary | ICD-10-CM | POA: Diagnosis not present

## 2019-12-03 DIAGNOSIS — Z8659 Personal history of other mental and behavioral disorders: Secondary | ICD-10-CM | POA: Diagnosis not present

## 2019-12-03 DIAGNOSIS — R030 Elevated blood-pressure reading, without diagnosis of hypertension: Secondary | ICD-10-CM | POA: Diagnosis not present

## 2019-12-03 DIAGNOSIS — Z124 Encounter for screening for malignant neoplasm of cervix: Secondary | ICD-10-CM

## 2019-12-03 MED ORDER — FLUOXETINE HCL 10 MG PO CAPS: 10.0000 mg | ORAL_CAPSULE | Freq: Every day | ORAL | 1 refills | Status: DC

## 2019-12-03 MED ORDER — HYDROCHLOROTHIAZIDE 12.5 MG PO TABS: 12.5000 mg | ORAL_TABLET | Freq: Every day | ORAL | 1 refills | Status: DC

## 2019-12-03 NOTE — Patient Instructions (Signed)
   Managing Your Hypertension Hypertension is commonly called high blood pressure. This is when the force of your blood pressing against the walls of your arteries is too strong. Arteries are blood vessels that carry blood from your heart throughout your body. Hypertension forces the heart to work harder to pump blood, and may cause the arteries to become narrow or stiff. Having untreated or uncontrolled hypertension can cause heart attack, stroke, kidney disease, and other problems. What are blood pressure readings? A blood pressure reading consists of a higher number over a lower number. Ideally, your blood pressure should be below 120/80. The first ("top") number is called the systolic pressure. It is a measure of the pressure in your arteries as your heart beats. The second ("bottom") number is called the diastolic pressure. It is a measure of the pressure in your arteries as the heart relaxes. What does my blood pressure reading mean? Blood pressure is classified into four stages. Based on your blood pressure reading, your health care provider may use the following stages to determine what type of treatment you need, if any. Systolic pressure and diastolic pressure are measured in a unit called mm Hg. Normal  Systolic pressure: below 120.  Diastolic pressure: below 80. Elevated  Systolic pressure: 120-129.  Diastolic pressure: below 80. Hypertension stage 1  Systolic pressure: 130-139.  Diastolic pressure: 80-89. Hypertension stage 2  Systolic pressure: 140 or above.  Diastolic pressure: 90 or above. What health risks are associated with hypertension? Managing your hypertension is an important responsibility. Uncontrolled hypertension can lead to:  A heart attack.  A stroke.  A weakened blood vessel (aneurysm).  Heart failure.  Kidney damage.  Eye damage.  Metabolic syndrome.  Memory and concentration problems. What changes can I make to manage my  hypertension? Hypertension can be managed by making lifestyle changes and possibly by taking medicines. Your health care provider will help you make a plan to bring your blood pressure within a normal range. Eating and drinking   Eat a diet that is high in fiber and potassium, and low in salt (sodium), added sugar, and fat. An example eating plan is called the DASH (Dietary Approaches to Stop Hypertension) diet. To eat this way: ? Eat plenty of fresh fruits and vegetables. Try to fill half of your plate at each meal with fruits and vegetables. ? Eat whole grains, such as whole wheat pasta, brown rice, or whole grain bread. Fill about one quarter of your plate with whole grains. ? Eat low-fat diary products. ? Avoid fatty cuts of meat, processed or cured meats, and poultry with skin. Fill about one quarter of your plate with lean proteins such as fish, chicken without skin, beans, eggs, and tofu. ? Avoid premade and processed foods. These tend to be higher in sodium, added sugar, and fat.  Reduce your daily sodium intake. Most people with hypertension should eat less than 1,500 mg of sodium a day.  Limit alcohol intake to no more than 1 drink a day for nonpregnant women and 2 drinks a day for men. One drink equals 12 oz of beer, 5 oz of wine, or 1 oz of hard liquor. Lifestyle  Work with your health care provider to maintain a healthy body weight, or to lose weight. Ask what an ideal weight is for you.  Get at least 30 minutes of exercise that causes your heart to beat faster (aerobic exercise) most days of the week. Activities may include walking, swimming, or biking.    Include exercise to strengthen your muscles (resistance exercise), such as weight lifting, as part of your weekly exercise routine. Try to do these types of exercises for 30 minutes at least 3 days a week.  Do not use any products that contain nicotine or tobacco, such as cigarettes and e-cigarettes. If you need help quitting,  ask your health care provider.  Control any long-term (chronic) conditions you have, such as high cholesterol or diabetes. Monitoring  Monitor your blood pressure at home as told by your health care provider. Your personal target blood pressure may vary depending on your medical conditions, your age, and other factors.  Have your blood pressure checked regularly, as often as told by your health care provider. Working with your health care provider  Review all the medicines you take with your health care provider because there may be side effects or interactions.  Talk with your health care provider about your diet, exercise habits, and other lifestyle factors that may be contributing to hypertension.  Visit your health care provider regularly. Your health care provider can help you create and adjust your plan for managing hypertension. Will I need medicine to control my blood pressure? Your health care provider may prescribe medicine if lifestyle changes are not enough to get your blood pressure under control, and if:  Your systolic blood pressure is 130 or higher.  Your diastolic blood pressure is 80 or higher. Take medicines only as told by your health care provider. Follow the directions carefully. Blood pressure medicines must be taken as prescribed. The medicine does not work as well when you skip doses. Skipping doses also puts you at risk for problems. Contact a health care provider if:  You think you are having a reaction to medicines you have taken.  You have repeated (recurrent) headaches.  You feel dizzy.  You have swelling in your ankles.  You have trouble with your vision. Get help right away if:  You develop a severe headache or confusion.  You have unusual weakness or numbness, or you feel faint.  You have severe pain in your chest or abdomen.  You vomit repeatedly.  You have trouble breathing. Summary  Hypertension is when the force of blood pumping  through your arteries is too strong. If this condition is not controlled, it may put you at risk for serious complications.  Your personal target blood pressure may vary depending on your medical conditions, your age, and other factors. For most people, a normal blood pressure is less than 120/80.  Hypertension is managed by lifestyle changes, medicines, or both. Lifestyle changes include weight loss, eating a healthy, low-sodium diet, exercising more, and limiting alcohol. This information is not intended to replace advice given to you by your health care provider. Make sure you discuss any questions you have with your health care provider. Document Revised: 08/31/2018 Document Reviewed: 04/06/2016 Elsevier Patient Education  2020 [employer]  

## 2019-12-03 NOTE — Progress Notes (Signed)
Date:  12/03/2019   Name:  Anna Cameron   DOB:  10/10/1975   MRN:  [id number]   Chief Complaint: Establish Care (not had a pcp) and ref gyn  Patient is a 44 year old female who presents for a comprehensive physical exam. The patient reports the following problems: depression/elevated pressure. Health maintenance has been reviewed upto date  Depression        This is a recurrent problem.  The onset quality is gradual.   The problem has been waxing and waning since onset.  Associated symptoms include fatigue, insomnia, irritable, restlessness and decreased interest.  Associated symptoms include no decreased concentration, no helplessness, no hopelessness, no appetite change, no myalgias, no headaches, not sad and no suicidal ideas.     The symptoms are aggravated by nothing.  Past treatments include SSRIs - Selective serotonin reuptake inhibitors.   Pertinent negatives include no anxiety. Hypertension This is a chronic problem. The current episode started more than 1 year ago. The problem has been gradually improving since onset. The problem is controlled. Pertinent negatives include no anxiety, blurred vision, chest pain, headaches, malaise/fatigue, neck pain, orthopnea, palpitations, peripheral edema, PND, shortness of breath or sweats. There are no associated agents to hypertension. Past treatments include nothing. There is no history of angina, kidney disease, CAD/MI, CVA, heart failure, left ventricular hypertrophy, PVD or retinopathy. There is no history of chronic renal disease, a hypertension causing med or renovascular disease.    Lab Results  Component Value Date   CREATININE 0.50 (L) 09/19/2012   BUN 10 09/19/2012   NA 133 (L) 09/19/2012   K 3.9 09/19/2012   CL 104 09/19/2012   CO2 18 (L) 09/19/2012   No results found for: CHOL, HDL, LDLCALC, LDLDIRECT, TRIG, CHOLHDL No results found for: TSH No results found for: HGBA1C Lab Results  Component Value Date   WBC 18.9 (H)  09/19/2012   HGB 15.8 09/19/2012   HCT 45.5 09/19/2012   MCV 96 09/19/2012   PLT 292 09/19/2012   Lab Results  Component Value Date   ALT 74 09/19/2012   AST 92 (H) 09/19/2012   ALKPHOS 125 09/19/2012   BILITOT 0.2 09/19/2012     Review of Systems  Constitutional: Positive for fatigue. Negative for appetite change, chills, fever, malaise/fatigue and unexpected weight change.  HENT: Negative for congestion, ear discharge, ear pain, rhinorrhea, sinus pressure, sneezing and sore throat.   Eyes: Negative for blurred vision, photophobia, pain, discharge, redness and itching.  Respiratory: Negative for cough, shortness of breath, wheezing and stridor.   Cardiovascular: Negative for chest pain, palpitations, orthopnea and PND.  Gastrointestinal: Negative for abdominal pain, blood in stool, constipation, diarrhea, nausea and vomiting.  Endocrine: Negative for cold intolerance, heat intolerance, polydipsia, polyphagia and polyuria.  Genitourinary: Negative for dysuria, flank pain, frequency, hematuria, menstrual problem, pelvic pain, urgency, vaginal bleeding and vaginal discharge.  Musculoskeletal: Negative for arthralgias, back pain, myalgias and neck pain.  Skin: Negative for rash.  Allergic/Immunologic: Negative for environmental allergies and food allergies.  Neurological: Negative for dizziness, weakness, light-headedness, numbness and headaches.  Hematological: Negative for adenopathy. Does not bruise/bleed easily.  Psychiatric/Behavioral: Positive for depression. Negative for decreased concentration, dysphoric mood and suicidal ideas. The patient has insomnia. The patient is not nervous/anxious.     There are no problems to display for this patient.   No Known Allergies  Past Surgical History:  Procedure Laterality Date  . ABDOMINAL HYSTERECTOMY    . CESAREAN SECTION  x 3  . CYSTOSCOPY WITH INJECTION N/A 09/05/2019   Procedure: CYSTOSCOPY WITH INJECTION BLADDER BOTOX;   Surgeon: Orson Ape, MD;  Location: [facility] ORS;  Service: Urology;  Laterality: N/A;    Social History   Tobacco Use  . Smoking status: Current Every Day Smoker    Packs/day: 1.50    Types: Cigarettes  . Smokeless tobacco: Never Used  Substance Use Topics  . Alcohol use: Not Currently  . Drug use: Never     Medication list has been reviewed and updated.  Current Meds  Medication Sig  . fexofenadine (ALLEGRA) 180 MG tablet Take 180 mg by mouth daily.  Marland Kitchen IRON-B12-VITAMINS PO Take by mouth.  . Multiple Vitamin (MULTIVITAMIN WITH MINERALS) TABS tablet Take 1 tablet by mouth daily.  . [DISCONTINUED] TOVIAZ 8 MG TB24 tablet Take 8 mg by mouth daily.    PHQ 2/9 Scores 12/03/2019  PHQ - 2 Score 4  PHQ- 9 Score 9    GAD 7 : Generalized Anxiety Score 12/03/2019  Nervous, Anxious, on Edge 0  Control/stop worrying 0  Worry too much - different things 2  Trouble relaxing 2  Restless 0  Easily annoyed or irritable 1  Afraid - awful might happen 0  Total GAD 7 Score 5  Anxiety Difficulty Not difficult at all    BP Readings from Last 3 Encounters:  12/03/19 130/90  09/05/19 (!) 148/53    Physical Exam Vitals and nursing note reviewed.  Constitutional:      General: She is irritable. She is not in acute distress.    Appearance: She is not diaphoretic.  HENT:     Head: Normocephalic and atraumatic.     Right Ear: Tympanic membrane, ear canal and external ear normal.     Left Ear: Tympanic membrane, ear canal and external ear normal.     Nose: Nose normal.  Eyes:     General:        Right eye: No discharge.        Left eye: No discharge.     Conjunctiva/sclera: Conjunctivae normal.     Pupils: Pupils are equal, round, and reactive to light.  Neck:     Thyroid: No thyromegaly.     Vascular: No JVD.  Cardiovascular:     Rate and Rhythm: Normal rate and regular rhythm.     Heart sounds: Normal heart sounds. No murmur heard.  No friction rub. No gallop.   Pulmonary:      Effort: Pulmonary effort is normal.     Breath sounds: Normal breath sounds. No wheezing or rhonchi.  Abdominal:     General: Bowel sounds are normal.     Palpations: Abdomen is soft. There is no mass.     Tenderness: There is no abdominal tenderness. There is no guarding.  Musculoskeletal:        General: Normal range of motion.     Cervical back: Normal range of motion and neck supple.  Lymphadenopathy:     Cervical: No cervical adenopathy.  Skin:    General: Skin is warm and dry.  Neurological:     Mental Status: She is alert.     Deep Tendon Reflexes: Reflexes are normal and symmetric.     Wt Readings from Last 3 Encounters:  12/03/19 176 lb (79.8 kg)  08/26/19 177 lb (80.3 kg)    BP 130/90   Pulse 72   Ht 5' 6.5" (1.689 m)   Wt 176 lb (79.8 kg)  BMI 27.98 kg/m   Assessment and Plan: 1. Establishing care with new doctor, encounter for [facility] care with new physician.  Patient's previous encounters were reviewed as well most recent labs, most recent imaging, as well well as care everywhere.  2. Elevated blood-pressure reading without diagnosis of hypertension Patient has had elevated blood pressure readings up-and-down other clinics in the past but not in stage II range.  We went over the new guidelines for blood pressure control and we elected to start 12.5 mg hydrochlorothiazide to bring down blood pressure readings a little bit. - hydrochlorothiazide (HYDRODIURIL) 12.5 MG tablet; Take 1 tablet (12.5 mg total) by mouth daily.  Dispense: 90 tablet; Refill: 1  3. Generalized edema Patient does have some generalized edema with swelling with readings as such during the course of the day she would benefit from a mild diuretic as well as the blood pressure effect. - hydrochlorothiazide (HYDRODIURIL) 12.5 MG tablet; Take 1 tablet (12.5 mg total) by mouth daily.  Dispense: 90 tablet; Refill: 1  4. History of depression Patient has had issues with depression in the  past that has been well controlled on fluoxetine.  We went over some of the other symptoms of depression such as insomnia and not enjoying things he used to do and restlessness in such and patient realizes that it probably would be beneficial to resume an SSRI in particular fluoxetine at 10 mg once a day.  We will recheck both blood pressure and PHQ in 3 months. - FLUoxetine (PROZAC) 10 MG capsule; Take 1 capsule (10 mg total) by mouth daily.  Dispense: 90 capsule; Refill: 1  5. Screening for cervical cancer Patient needs cervical cancer screening and we will refer to gynecology for surveillance. - Ambulatory referral to Gynecology  Patient will return in 3 months for recheck of blood pressure and PHQ and we would do fasting labs at that time.

## 2019-12-09 ENCOUNTER — Telehealth: Payer: Self-pay | Admitting: General Practice

## 2019-12-09 NOTE — Telephone Encounter (Signed)
Called patient to schedule appointment,unable to leave voice mail. Referral is attached for reference.  

## 2019-12-25 ENCOUNTER — Other Ambulatory Visit: Payer: Self-pay | Admitting: Family Medicine

## 2019-12-25 DIAGNOSIS — Z8659 Personal history of other mental and behavioral disorders: Secondary | ICD-10-CM

## 2020-01-24 ENCOUNTER — Other Ambulatory Visit: Payer: Self-pay

## 2020-01-24 ENCOUNTER — Ambulatory Visit (INDEPENDENT_AMBULATORY_CARE_PROVIDER_SITE_OTHER): Payer: 59 | Admitting: Family Medicine

## 2020-01-24 ENCOUNTER — Encounter: Payer: Self-pay | Admitting: Family Medicine

## 2020-01-24 VITALS — BP 118/70 | HR 71 | Ht 66.5 in | Wt 178.0 lb

## 2020-01-24 DIAGNOSIS — R718 Other abnormality of red blood cells: Secondary | ICD-10-CM | POA: Diagnosis not present

## 2020-01-24 DIAGNOSIS — R030 Elevated blood-pressure reading, without diagnosis of hypertension: Secondary | ICD-10-CM | POA: Diagnosis not present

## 2020-01-24 DIAGNOSIS — R5383 Other fatigue: Secondary | ICD-10-CM | POA: Diagnosis not present

## 2020-01-24 DIAGNOSIS — Z8659 Personal history of other mental and behavioral disorders: Secondary | ICD-10-CM

## 2020-01-24 DIAGNOSIS — R601 Generalized edema: Secondary | ICD-10-CM

## 2020-01-24 MED ORDER — HYDROCHLOROTHIAZIDE 12.5 MG PO TABS: 12.5000 mg | ORAL_TABLET | Freq: Every day | ORAL | 1 refills | Status: DC

## 2020-01-24 MED ORDER — FLUOXETINE HCL 20 MG PO TABS: 20.0000 mg | ORAL_TABLET | Freq: Every day | ORAL | 1 refills | Status: DC

## 2020-01-24 NOTE — Progress Notes (Signed)
Date:  01/24/2020   Name:  Anna Cameron   DOB:  [birth year]   MRN:  [id number]   Chief Complaint: Follow-up (started on fluoxetine and HCTZ- fatigue is "terrible")  Thyroid Problem Presents for initial visit. Symptoms include anxiety, cold intolerance, constipation, depressed mood, dry skin, fatigue, leg swelling and weight gain. Patient reports no diaphoresis, diarrhea, hair loss, heat intolerance, hoarse voice, menstrual problem, nail problem, palpitations, tremors, visual change or weight loss. The symptoms have been stable. The treatment provided moderate relief.    Lab Results  Component Value Date   CREATININE 0.50 (L) 09/19/2012   BUN 10 09/19/2012   NA 133 (L) 09/19/2012   K 3.9 09/19/2012   CL 104 09/19/2012   CO2 18 (L) 09/19/2012   No results found for: CHOL, HDL, LDLCALC, LDLDIRECT, TRIG, CHOLHDL No results found for: TSH No results found for: HGBA1C Lab Results  Component Value Date   WBC 18.9 (H) 09/19/2012   HGB 15.8 09/19/2012   HCT 45.5 09/19/2012   MCV 96 09/19/2012   PLT 292 09/19/2012   Lab Results  Component Value Date   ALT 74 09/19/2012   AST 92 (H) 09/19/2012   ALKPHOS 125 09/19/2012   BILITOT 0.2 09/19/2012     Review of Systems  Constitutional: Positive for fatigue and weight gain. Negative for chills, diaphoresis, fever, unexpected weight change and weight loss.  HENT: Negative for congestion, ear discharge, ear pain, hoarse voice, postnasal drip, rhinorrhea, sinus pressure, sneezing and sore throat.   Eyes: Negative for photophobia, pain, discharge, redness and itching.  Respiratory: Negative for cough, shortness of breath, wheezing and stridor.   Cardiovascular: Negative for chest pain, palpitations and leg swelling.  Gastrointestinal: Positive for constipation. Negative for abdominal pain, blood in stool, diarrhea, nausea and vomiting.  Endocrine: Positive for cold intolerance. Negative for heat intolerance, polydipsia, polyphagia and  polyuria.  Genitourinary: Negative for dysuria, flank pain, frequency, hematuria, menstrual problem, pelvic pain, urgency, vaginal bleeding and vaginal discharge.  Musculoskeletal: Negative for arthralgias, back pain and myalgias.  Skin: Negative for rash.  Allergic/Immunologic: Negative for environmental allergies and food allergies.  Neurological: Negative for dizziness, tremors, weakness, light-headedness, numbness and headaches.  Hematological: Negative for adenopathy. Does not bruise/bleed easily.  Psychiatric/Behavioral: Negative for dysphoric mood. The patient is nervous/anxious.     There are no problems to display for this patient.   No Known Allergies  Past Surgical History:  Procedure Laterality Date  . ABDOMINAL HYSTERECTOMY    . CESAREAN SECTION     x 3  . CYSTOSCOPY WITH INJECTION N/A 09/05/2019   Procedure: CYSTOSCOPY WITH INJECTION BLADDER BOTOX;  Surgeon: Orson Ape, MD;  Location: [facility] ORS;  Service: Urology;  Laterality: N/A;    Social History   Tobacco Use  . Smoking status: Current Every Day Smoker    Packs/day: 1.50    Types: Cigarettes  . Smokeless tobacco: Never Used  Substance Use Topics  . Alcohol use: Not Currently  . Drug use: Never     Medication list has been reviewed and updated.  Current Meds  Medication Sig  . fexofenadine (ALLEGRA) 180 MG tablet Take 180 mg by mouth daily.  Marland Kitchen FLUoxetine (PROZAC) 10 MG capsule Take 1 capsule (10 mg total) by mouth daily.  . hydrochlorothiazide (HYDRODIURIL) 12.5 MG tablet Take 1 tablet (12.5 mg total) by mouth daily.  Marland Kitchen IRON-B12-VITAMINS PO Take by mouth.  . Multiple Vitamin (MULTIVITAMIN WITH MINERALS) TABS tablet Take 1 tablet by  mouth daily.    PHQ 2/9 Scores 01/24/2020 12/03/2019  PHQ - 2 Score 2 4  PHQ- 9 Score 10 9    GAD 7 : Generalized Anxiety Score 01/24/2020 12/03/2019  Nervous, Anxious, on Edge 0 0  Control/stop worrying 0 0  Worry too much - different things 0 2  Trouble relaxing 0 2   Restless 0 0  Easily annoyed or irritable 3 1  Afraid - awful might happen 0 0  Total GAD 7 Score 3 5  Anxiety Difficulty Not difficult at all Not difficult at all    BP Readings from Last 3 Encounters:  01/24/20 118/70  12/03/19 130/90  09/05/19 (!) 148/53    Physical Exam Vitals and nursing note reviewed.  Constitutional:      Appearance: She is well-developed.  HENT:     Head: Normocephalic.     Right Ear: Tympanic membrane, ear canal and external ear normal.     Left Ear: Tympanic membrane, ear canal and external ear normal.     Nose: Nose normal. No congestion or rhinorrhea.     Mouth/Throat:     Mouth: Mucous membranes are moist.  Eyes:     General: Lids are everted, no foreign bodies appreciated. No scleral icterus.       Left eye: No foreign body or hordeolum.     Extraocular Movements: Extraocular movements intact.     Conjunctiva/sclera: Conjunctivae normal.     Right eye: Right conjunctiva is not injected.     Left eye: Left conjunctiva is not injected.     Pupils: Pupils are equal, round, and reactive to light.  Neck:     Thyroid: No thyroid mass, thyromegaly or thyroid tenderness.     Vascular: Normal carotid pulses. No carotid bruit, hepatojugular reflux or JVD.     Trachea: No tracheal deviation.  Cardiovascular:     Rate and Rhythm: Normal rate and regular rhythm.     Heart sounds: Normal heart sounds. No murmur heard.  No friction rub. No gallop.   Pulmonary:     Effort: Pulmonary effort is normal. No respiratory distress.     Breath sounds: Normal breath sounds. No wheezing, rhonchi or rales.  Abdominal:     General: Bowel sounds are normal. There is no distension.     Palpations: Abdomen is soft. There is no mass.     Tenderness: There is no abdominal tenderness. There is no guarding or rebound.     Hernia: No hernia is present.  Musculoskeletal:        General: No tenderness. Normal range of motion.     Cervical back: Normal range of motion and  neck supple.  Lymphadenopathy:     Cervical: No cervical adenopathy.     Right cervical: No superficial, deep or posterior cervical adenopathy.    Left cervical: No superficial, deep or posterior cervical adenopathy.  Skin:    General: Skin is warm.     Findings: No rash.  Neurological:     Mental Status: She is alert and oriented to person, place, and time.     Cranial Nerves: No cranial nerve deficit.     Deep Tendon Reflexes: Reflexes normal.  Psychiatric:        Mood and Affect: Mood is not anxious or depressed.     Wt Readings from Last 3 Encounters:  01/24/20 178 lb (80.7 kg)  12/03/19 176 lb (79.8 kg)  08/26/19 177 lb (80.3 kg)    BP 118/70  Pulse 71   Ht 5' 6.5" (1.689 m)   Wt 178 lb (80.7 kg)   SpO2 100%   BMI 28.30 kg/m   Assessment and Plan: 1. Fatigue, unspecified type Relatively new onset.  Persistent.  Patient is also noted some weight gain with fatigue that is unresolving.  We will initiate a fatigue work-up with a CMP, CBC, and thyroid panel.  Patient is currently on Prozac at the lowest dose and as noted below we will likely increase - Comprehensive metabolic panel - CBC with Differential/Platelet - Thyroid Panel With TSH  2. History of depression Chronic.  Uncontrolled.  Relatively stable.  PHQ is ten previous visit was nine patient used to be on Prozac in the past and it was restarted at 10 AM we will increase to 20 mg daily.  Patient will initially double up and if that is the case that there is some improvement patient will continue.  Incidentally there has been improvement of her gad score to three. - FLUoxetine (PROZAC) 20 MG tablet; Take 1 tablet (20 mg total) by mouth daily.  Dispense: 90 tablet; Refill: 1  3. Elevated blood-pressure reading without diagnosis of hypertension Chronic.  Controlled.  Stable.  Significant improvement of blood pressure in the one 10-70 range on hydrochlorothiazide 12.5 mg daily with incidental improvement of the  patient's edema. - hydrochlorothiazide (HYDRODIURIL) 12.5 MG tablet; Take 1 tablet (12.5 mg total) by mouth daily.  Dispense: 90 tablet; Refill: 1  4. Generalized edema Previously on no medication but sodium control.  Patient has had significant improvement of her generalized edema on hydrochlorothiazide 12.5 mg.  Will check CMP for electrolyte concerns. - hydrochlorothiazide (HYDRODIURIL) 12.5 MG tablet; Take 1 tablet (12.5 mg total) by mouth daily.  Dispense: 90 tablet; Refill: 1

## 2020-01-25 LAB — CBC WITH DIFFERENTIAL/PLATELET
Basophils Absolute: 0.1 10*3/uL (ref 0.0–0.2)
Basos: 1 %
EOS (ABSOLUTE): 0.3 10*3/uL (ref 0.0–0.4)
Eos: 2 %
Hematocrit: 54 % — ABNORMAL HIGH (ref 34.0–46.6)
Hemoglobin: 17.8 g/dL — ABNORMAL HIGH (ref 11.1–15.9)
Immature Grans (Abs): 0 10*3/uL (ref 0.0–0.1)
Immature Granulocytes: 0 %
Lymphocytes Absolute: 2.4 10*3/uL (ref 0.7–3.1)
Lymphs: 23 %
MCH: 31.3 pg (ref 26.6–33.0)
MCHC: 33 g/dL (ref 31.5–35.7)
MCV: 95 fL (ref 79–97)
Monocytes Absolute: 0.9 10*3/uL (ref 0.1–0.9)
Monocytes: 8 %
Neutrophils Absolute: 6.8 10*3/uL (ref 1.4–7.0)
Neutrophils: 66 %
Platelets: 362 10*3/uL (ref 150–450)
RBC: 5.68 x10E6/uL — ABNORMAL HIGH (ref 3.77–5.28)
RDW: 12.3 % (ref 11.7–15.4)
WBC: 10.4 10*3/uL (ref 3.4–10.8)

## 2020-01-25 LAB — COMPREHENSIVE METABOLIC PANEL
ALT: 20 IU/L (ref 0–32)
AST: 21 IU/L (ref 0–40)
Albumin/Globulin Ratio: 2.2 (ref 1.2–2.2)
Albumin: 4 g/dL (ref 3.8–4.8)
Alkaline Phosphatase: 71 IU/L (ref 48–121)
BUN/Creatinine Ratio: 20 (ref 9–23)
BUN: 14 mg/dL (ref 6–24)
Bilirubin Total: <0.2 mg/dL (ref 0.0–1.2)
CO2: 25 mmol/L (ref 20–29)
Calcium: 9.4 mg/dL (ref 8.7–10.2)
Chloride: 102 mmol/L (ref 96–106)
Creatinine, Ser: 0.7 mg/dL (ref 0.57–1.00)
GFR calc Af Amer: 123 mL/min/{1.73_m2} (ref >59)
GFR calc non Af Amer: 106 mL/min/{1.73_m2} (ref >59)
Globulin, Total: 1.8 g/dL (ref 1.5–4.5)
Glucose: 53 mg/dL — ABNORMAL LOW (ref 65–99)
Potassium: 3.6 mmol/L (ref 3.5–5.2)
Sodium: 143 mmol/L (ref 134–144)
Total Protein: 5.8 g/dL — ABNORMAL LOW (ref 6.0–8.5)

## 2020-01-25 LAB — THYROID PANEL WITH TSH
Free Thyroxine Index: 1.6 (ref 1.2–4.9)
T3 Uptake Ratio: 22 % — ABNORMAL LOW (ref 24–39)
T4, Total: 7.2 ug/dL (ref 4.5–12.0)
TSH: 2.21 u[IU]/mL (ref 0.450–4.500)

## 2020-01-29 LAB — SPECIMEN STATUS REPORT

## 2020-01-29 LAB — IRON AND TIBC
Iron Saturation: 27 % (ref 15–55)
Iron: 86 ug/dL (ref 27–159)
Total Iron Binding Capacity: 323 ug/dL (ref 250–450)
UIBC: 237 ug/dL (ref 131–425)

## 2020-02-10 ENCOUNTER — Other Ambulatory Visit: Payer: 59

## 2020-02-10 ENCOUNTER — Other Ambulatory Visit: Payer: Self-pay

## 2020-02-10 DIAGNOSIS — D539 Nutritional anemia, unspecified: Secondary | ICD-10-CM | POA: Diagnosis not present

## 2020-02-11 ENCOUNTER — Other Ambulatory Visit: Payer: Self-pay

## 2020-02-11 DIAGNOSIS — Z20822 Contact with and (suspected) exposure to covid-19: Secondary | ICD-10-CM | POA: Diagnosis not present

## 2020-02-11 DIAGNOSIS — D582 Other hemoglobinopathies: Secondary | ICD-10-CM

## 2020-02-11 LAB — HEMOGLOBIN: Hemoglobin: 16.4 g/dL — ABNORMAL HIGH (ref 11.1–15.9)

## 2020-02-11 LAB — GLUCOSE, RANDOM: Glucose: 73 mg/dL (ref 65–99)

## 2020-02-11 LAB — HEMATOCRIT: Hematocrit: 49.1 % — ABNORMAL HIGH (ref 34.0–46.6)

## 2020-02-11 NOTE — Progress Notes (Unsigned)
Ref placed to hematology 

## 2020-02-18 NOTE — Progress Notes (Signed)
[facility]  [address] [place], [place] [address] Phone: [phone]  Fax: [phone]   Clinic Day:  02/19/2020  Referring physician: Duanne Limerick, MD  Chief Complaint: Anna Cameron is a 44 y.o. female with an elevated hemoglobin who is referred in consultation by Dr. Elizabeth Sauer for assessment and management.   HPI: The patient saw Dr. Yetta Barre on 01/24/2020 for new persistent fatigue and weight gain. She smokes 1.5 packs/day.  Hematocrit was 54.0, hemoglobin 17.8, platelets 362,000, WBC 10,400. CMP was normal.  Iron saturation was 27% and TIBC was 323. She just started seeing Dr. Yetta Barre and was not aware of her elevated hemoglobin.  Labs followed: 09/19/2012:  Hematocrit 45.5, hemoglobin 15.8, platelets 292,000, WBC 18,900. 09/22/2016:  Hematocrit 41.5, hemoglobin 14.2, platelets 350,000, WBC 10,200.  01/24/2020:  Hematocrit 54.0, hemoglobin 17.8, platelets 362,000, WBC 10,400. 02/10/2020:  Hematocrit 49.1, hemoglobin 16.4.  Symptomatically, she has been "ok".  She reports headaches, urinary frequency, fatigue x several years, and a 15 lb weight gain over the past year. She has not changed her diet. Her thyroid labs were normal. She started getting sweats "all the time" about a month ago, but denies drenching night sweats.  The patient denies fevers, changes in vision, runny nose, sore throat, cough, shortness of breath, chest pain, palpitations, early satiety, nausea, vomiting, diarrhea, reflux,  bone or joint symptoms, skin changes, numbness, weakness, balance or coordination problem, hematuria, and bleeding of any kind.  She has not had any medical problems in the past. She denies a history of blood clots, cardiac problems, lung problems, and sleep apnea. She snores occasionally and does not feel rested when she wakes up. This has been going on for a while. She sleeps for 7 hours each night and works 40 hours per week.   The patient does not  have carbon monoxide monitors at home but does not feel less fatigued/lethargic when she leaves her house. She does not use any hormonal injections.  Her father had pancreatic cancer. Her maternal grandmother had lung cancer. She denies a family history of blood disorders.   Past Medical History:  Diagnosis Date  . Allergy     Past Surgical History:  Procedure Laterality Date  . ABDOMINAL HYSTERECTOMY    . CESAREAN SECTION     x 3  . CYSTOSCOPY WITH INJECTION N/A 09/05/2019   Procedure: CYSTOSCOPY WITH INJECTION BLADDER BOTOX;  Surgeon: Orson Ape, MD;  Location: [facility] ORS;  Service: Urology;  Laterality: N/A;    Family History  Problem Relation Age of Onset  . Cancer Father   . Cancer Maternal Grandmother     Social History:  reports that she has been smoking cigarettes. She has been smoking about 1.50 packs per day. She has never used smokeless tobacco. She reports previous alcohol use. She reports that she does not use drugs. She smokes a pack and a half daily and started when she was 16. She denies alcohol use. She denies known exposure to radiation or toxins. 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 The patient is alone today.  Allergies: No Known Allergies  Current Medications: Current Outpatient Medications  Medication Sig Dispense Refill  . fexofenadine (ALLEGRA) 180 MG tablet Take 180 mg by mouth daily.    Marland Kitchen FLUoxetine (PROZAC) 20 MG tablet Take 1 tablet (20 mg total) by mouth daily. 90 tablet 1  . hydrochlorothiazide (HYDRODIURIL) 12.5 MG tablet Take 1 tablet (12.5 mg total)  by mouth daily. 90 tablet 1  . Multiple Vitamin (MULTIVITAMIN WITH MINERALS) TABS tablet Take 1 tablet by mouth daily.    . vitamin B-12 (CYANOCOBALAMIN) 50 MCG tablet Take 100 mcg by mouth daily.    Marland Kitchen IRON-B12-VITAMINS PO Take by mouth. (Patient not taking: Reported on 02/19/2020)     No current facility-administered medications for this visit.    Review of Systems    Constitutional: Positive for diaphoresis and malaise/fatigue (x years). Negative for chills, fever and weight loss (gained 15 lbs over the past year).  HENT: Negative for congestion, ear discharge, ear pain, hearing loss, nosebleeds, sinus pain, sore throat and tinnitus.   Eyes: Negative for blurred vision.  Respiratory: Negative for cough, hemoptysis, sputum production and shortness of breath.   Cardiovascular: Negative for chest pain, palpitations and leg swelling.  Gastrointestinal: Negative for abdominal pain, blood in stool, constipation, diarrhea, heartburn, melena, nausea and vomiting.  Genitourinary: Positive for frequency. Negative for dysuria, hematuria and urgency.  Musculoskeletal: Negative for back pain, joint pain, myalgias and neck pain.  Skin: Negative for itching and rash.  Neurological: Positive for headaches. Negative for dizziness, tingling, sensory change and weakness.  Endo/Heme/Allergies: Does not bruise/bleed easily.  Psychiatric/Behavioral: Negative for depression and memory loss. The patient is not nervous/anxious and does not have insomnia.   All other systems reviewed and are negative.  Performance status (ECOG): 1  Vitals Blood pressure 117/79, pulse 81, temperature 98.4 F (36.9 C), temperature source Tympanic, resp. rate 16, weight 183 lb 1.5 oz (83 kg), SpO2 99 %.   Physical Exam Vitals and nursing note reviewed.  Constitutional:      General: She is not in acute distress.    Appearance: She is not diaphoretic.  HENT:     Head: Normocephalic and atraumatic.     Comments: Long black hair.    Mouth/Throat:     Mouth: Mucous membranes are moist.     Pharynx: Oropharynx is clear.  Eyes:     General: No scleral icterus.    Extraocular Movements: Extraocular movements intact.     Conjunctiva/sclera: Conjunctivae normal.     Pupils: Pupils are equal, round, and reactive to light.     Comments: Brown eyes.  Cardiovascular:     Rate and Rhythm: Normal  rate and regular rhythm.     Heart sounds: Normal heart sounds. No murmur heard.   Pulmonary:     Effort: Pulmonary effort is normal. No respiratory distress.     Breath sounds: Normal breath sounds. No wheezing or rales.  Chest:     Chest wall: No tenderness.  Abdominal:     General: Bowel sounds are normal. There is no distension.     Palpations: Abdomen is soft. There is no hepatomegaly, splenomegaly or mass.     Tenderness: There is no abdominal tenderness. There is no guarding or rebound.  Musculoskeletal:        General: No swelling or tenderness. Normal range of motion.     Cervical back: Normal range of motion and neck supple.  Lymphadenopathy:     Head:     Right side of head: No preauricular, posterior auricular or occipital adenopathy.     Left side of head: No preauricular, posterior auricular or occipital adenopathy.     Cervical: No cervical adenopathy.     Upper Body:     Right upper body: No supraclavicular or axillary adenopathy.     Left upper body: No supraclavicular or axillary adenopathy.  Lower Body: No right inguinal adenopathy. No left inguinal adenopathy.  Skin:    General: Skin is warm and dry.     Comments: Tattoo left wrist.  Tongue and naval piercing.  Neurological:     Mental Status: She is alert and oriented to person, place, and time.  Psychiatric:        Behavior: Behavior normal.        Thought Content: Thought content normal.        Judgment: Judgment normal.     No visits with results within 3 Day(s) from this visit.  Latest known visit with results is:  Lab on 02/10/2020  Component Date Value Ref Range Status  . Hemoglobin 02/10/2020 16.4* 11.1 - 15.9 g/dL Final  . Hematocrit 16/02/9603 49.1* 34.0 - 46.6 % Final  . Glucose 02/10/2020 73  65 - 99 mg/dL Final    Assessment:  LADON HENEY is a 44 y.o. female with erythrocytosis.  She smokes 1 1/2 packs per day.  She does not have a carbon monoxide monitor at home, but does not feel  less fatigued/lethargic when she leaves her house. She does not use any hormonal injections.  She may have sleep apnea.  She received the Pfizer COVID-19 vaccine on 08/30/2019 and 09/20/2019.  Symptomatically, she feels "ok".  She notes headaches but no erythromyalgia.  She denies any B symptoms.  She denies any history of thrombosis.   Plan: 1.   Labs today:  CBC with diff, ferritin, epo level, carbon monoxide level, JAK2 with reflex, urinalysis. 2.   Peripheral smear for review. 3.   [name]  Differential diagnosis includes: cardiopulmonary issues, renal issues, sleep apnea, myeloproliferative disorders.  Etiology may be secondary to smoking or possibly sleep apnea.  CXR (PA and lateral) today. 4.   RTC in 7-10 days for MD assessment, review of work-up and discussion regarding direction of therapy.  I discussed the assessment and treatment plan with the patient.  The patient was provided an opportunity to ask questions and all were answered.  The patient agreed with the plan and demonstrated an understanding of the instructions.  The patient was advised to call back if the symptoms worsen or if the condition fails to improve as anticipated.  I provided 24 minutes of face-to-face time during this this encounter and > 50% was spent counseling as documented under my assessment and plan.  An additional 10 minutes were spent reviewing her chart (Epic and Care Everywhere) including notes, labs, and imaging studies.    Anna C. Merlene Pulling, MD, PhD    02/19/2020, 3:17 PM  I, Anna Cameron, am acting as Neurosurgeon for [employer]. Merlene Pulling, MD, PhD.  I, Anna C. Merlene Pulling, MD, have reviewed the above documentation for accuracy and completeness, and I agree with the above.

## 2020-02-19 ENCOUNTER — Encounter: Payer: Self-pay | Admitting: Hematology and Oncology

## 2020-02-19 ENCOUNTER — Inpatient Hospital Stay: Payer: 59

## 2020-02-19 ENCOUNTER — Inpatient Hospital Stay: Payer: 59 | Attending: Hematology and Oncology | Admitting: Hematology and Oncology

## 2020-02-19 ENCOUNTER — Ambulatory Visit
Admission: RE | Admit: 2020-02-19 | Discharge: 2020-02-19 | Disposition: A | Discharge status: Home / Self Care | Payer: 59 | Source: Ambulatory Visit | Attending: Hematology and Oncology | Admitting: Hematology and Oncology

## 2020-02-19 ENCOUNTER — Other Ambulatory Visit: Payer: Self-pay

## 2020-02-19 ENCOUNTER — Ambulatory Visit
Admission: RE | Admit: 2020-02-19 | Discharge: 2020-02-19 | Disposition: A | Discharge status: Home / Self Care | Payer: 59 | Source: Home / Self Care | Attending: Hematology and Oncology | Admitting: Hematology and Oncology

## 2020-02-19 VITALS — BP 117/79 | HR 81 | Temp 98.4°F | Resp 16 | Wt 183.1 lb

## 2020-02-19 DIAGNOSIS — D751 Secondary polycythemia: Secondary | ICD-10-CM

## 2020-02-19 DIAGNOSIS — F17210 Nicotine dependence, cigarettes, uncomplicated: Secondary | ICD-10-CM | POA: Diagnosis not present

## 2020-02-19 LAB — URINALYSIS, COMPLETE (UACMP) WITH MICROSCOPIC
Bilirubin Urine: NEGATIVE
Glucose, UA: NEGATIVE mg/dL
Hgb urine dipstick: NEGATIVE
Ketones, ur: NEGATIVE mg/dL
Leukocytes,Ua: NEGATIVE
Nitrite: NEGATIVE
Protein, ur: NEGATIVE mg/dL
Specific Gravity, Urine: <1.005 — ABNORMAL LOW (ref 1.005–1.030)
pH: 5 (ref 5.0–8.0)

## 2020-02-19 LAB — FERRITIN: Ferritin: 59 ng/mL (ref 11–307)

## 2020-02-19 LAB — CBC WITH DIFFERENTIAL/PLATELET
Abs Immature Granulocytes: 0.02 10*3/uL (ref 0.00–0.07)
Basophils Absolute: 0.1 10*3/uL (ref 0.0–0.1)
Basophils Relative: 1 %
Eosinophils Absolute: 0.1 10*3/uL (ref 0.0–0.5)
Eosinophils Relative: 2 %
HCT: 46.8 % — ABNORMAL HIGH (ref 36.0–46.0)
Hemoglobin: 16.3 g/dL — ABNORMAL HIGH (ref 12.0–15.0)
Immature Granulocytes: 0 %
Lymphocytes Relative: 16 %
Lymphs Abs: 1.2 10*3/uL (ref 0.7–4.0)
MCH: 32.1 pg (ref 26.0–34.0)
MCHC: 34.8 g/dL (ref 30.0–36.0)
MCV: 92.1 fL (ref 80.0–100.0)
Monocytes Absolute: 1 10*3/uL (ref 0.1–1.0)
Monocytes Relative: 13 %
Neutro Abs: 5 10*3/uL (ref 1.7–7.7)
Neutrophils Relative %: 68 %
Platelets: 255 10*3/uL (ref 150–400)
RBC: 5.08 MIL/uL (ref 3.87–5.11)
RDW: 12.4 % (ref 11.5–15.5)
WBC: 7.4 10*3/uL (ref 4.0–10.5)
nRBC: 0 % (ref 0.0–0.2)

## 2020-02-19 NOTE — Progress Notes (Signed)
Patient here for initial oncology appointment, expresses concerns of extreme fatigue and urinary frequency.

## 2020-02-20 DIAGNOSIS — U071 COVID-19: Secondary | ICD-10-CM | POA: Diagnosis not present

## 2020-02-20 DIAGNOSIS — Z20822 Contact with and (suspected) exposure to covid-19: Secondary | ICD-10-CM | POA: Diagnosis not present

## 2020-02-20 LAB — CARBON MONOXIDE, BLOOD (PERFORMED AT REF LAB): Carbon Monoxide, Blood: 7.3 % — ABNORMAL HIGH (ref 0.0–3.6)

## 2020-02-20 LAB — ERYTHROPOIETIN: Erythropoietin: 8.7 m[IU]/mL (ref 2.6–18.5)

## 2020-02-23 ENCOUNTER — Encounter: Payer: Self-pay | Admitting: Physician Assistant

## 2020-02-23 ENCOUNTER — Other Ambulatory Visit (HOSPITAL_COMMUNITY): Payer: Self-pay | Admitting: Physician Assistant

## 2020-02-23 NOTE — Progress Notes (Signed)
I connected by phone with Anna Cameron on 02/23/2020 at 10:26 AM to discuss the potential use of a new treatment for mild to moderate COVID-19 viral infection in non-hospitalized patients.  This patient is a 44 y.o. female that meets the FDA criteria for Emergency Use Authorization of COVID monoclonal antibody casirivimab/imdevimab or bamlanivimab/eteseviamb.  Has a (+) direct SARS-CoV-2 viral test result  Has mild or moderate COVID-19   Is NOT hospitalized due to COVID-19  Is within 10 days of symptom onset  Has at least one of the high risk factor(s) for progression to severe COVID-19 and/or hospitalization as defined in EUA.  Specific high risk criteria : Cardiovascular disease or hypertension   I have spoken and communicated the following to the patient or parent/caregiver regarding COVID monoclonal antibody treatment:  1. FDA has authorized the emergency use for the treatment of mild to moderate COVID-19 in adults and pediatric patients with positive results of direct SARS-CoV-2 viral testing who are 90 years of age and older weighing at least 40 kg, and who are at high risk for progressing to severe COVID-19 and/or hospitalization.  2. The significant known and potential risks and benefits of COVID monoclonal antibody, and the extent to which such potential risks and benefits are unknown.  3. Information on available alternative treatments and the risks and benefits of those alternatives, including clinical trials.  4. Patients treated with COVID monoclonal antibody should continue to self-isolate and use infection control measures (e.g., wear mask, isolate, social distance, avoid sharing personal items, clean and disinfect "high touch" surfaces, and frequent handwashing) according to CDC guidelines.   5. The patient or parent/caregiver has the option to accept or refuse COVID monoclonal antibody treatment.  After reviewing this information with the patient, the patient has  agreed to receive one of the available covid 19 monoclonal antibodies and will be provided an appropriate fact sheet prior to infusion. Jodell Cipro, PA-C 02/23/2020 10:26 AM

## 2020-02-24 ENCOUNTER — Other Ambulatory Visit (HOSPITAL_COMMUNITY): Payer: Self-pay

## 2020-02-24 ENCOUNTER — Ambulatory Visit (HOSPITAL_COMMUNITY)
Admission: RE | Admit: 2020-02-24 | Discharge: 2020-02-24 | Disposition: A | Discharge status: Home / Self Care | Payer: 59 | Source: Ambulatory Visit | Attending: Pulmonary Disease | Admitting: Pulmonary Disease

## 2020-02-24 DIAGNOSIS — U071 COVID-19: Secondary | ICD-10-CM | POA: Insufficient documentation

## 2020-02-24 MED ORDER — EPINEPHRINE 0.3 MG/0.3ML IJ SOAJ: 0.3000 mg | Freq: Once | INTRAMUSCULAR | Status: DC | PRN

## 2020-02-24 MED ORDER — ALBUTEROL SULFATE HFA 108 (90 BASE) MCG/ACT IN AERS: 2.0000 | INHALATION_SPRAY | Freq: Once | RESPIRATORY_TRACT | Status: DC | PRN

## 2020-02-24 MED ORDER — METHYLPREDNISOLONE SODIUM SUCC 125 MG IJ SOLR: 125.0000 mg | Freq: Once | INTRAMUSCULAR | Status: DC | PRN

## 2020-02-24 MED ORDER — DIPHENHYDRAMINE HCL 50 MG/ML IJ SOLN: 50.0000 mg | Freq: Once | INTRAMUSCULAR | Status: DC | PRN

## 2020-02-24 MED ORDER — SODIUM CHLORIDE 0.9 % IV SOLN: INTRAVENOUS | Status: DC | PRN

## 2020-02-24 MED ORDER — SODIUM CHLORIDE 0.9 % IV SOLN
1200.0000 mg | Freq: Once | INTRAVENOUS | Status: AC
  Administered 2020-02-24: 1200 mg via INTRAVENOUS

## 2020-02-24 MED ORDER — FAMOTIDINE IN NACL 20-0.9 MG/50ML-% IV SOLN: 20.0000 mg | Freq: Once | INTRAVENOUS | Status: DC | PRN

## 2020-02-24 NOTE — Discharge Instructions (Signed)
10 Things You Can Do to Manage Your COVID-19 Symptoms at Home If you have possible or confirmed COVID-19: 1. Stay home from work and school. And stay away from other public places. If you must go out, avoid using any kind of public transportation, ridesharing, or taxis. 2. Monitor your symptoms carefully. If your symptoms get worse, call your healthcare provider immediately. 3. Get rest and stay hydrated. 4. If you have a medical appointment, call the healthcare provider ahead of time and tell them that you have or may have COVID-19. 5. For medical emergencies, call 911 and notify the dispatch personnel that you have or may have COVID-19. 6. Cover your cough and sneezes with a tissue or use the inside of your elbow. 7. Wash your hands often with soap and water for at least 20 seconds or clean your hands with an alcohol-based hand sanitizer that contains at least 60% alcohol. 8. As much as possible, stay in a specific room and away from other people in your home. Also, you should use a separate bathroom, if available. If you need to be around other people in or outside of the home, wear a mask. 9. Avoid sharing personal items with other people in your household, like dishes, towels, and bedding. 10. Clean all surfaces that are touched often, like counters, tabletops, and doorknobs. Use household cleaning sprays or wipes according to the label instructions. What types of side effects do monoclonal antibody drugs cause?  Common side effects  In general, the more common side effects caused by monoclonal antibody drugs include: Allergic reactions, such as hives or itching Flu-like signs and symptoms, including chills, fatigue, fever, and muscle aches and pains Nausea, vomiting Diarrhea Skin rashes Low blood pressure   The CDC is recommending patients who receive monoclonal antibody treatments wait at least 90 days before being vaccinated.  11. Currently, there are no data on the safety and  efficacy of mRNA COVID-19 vaccines in persons who received monoclonal antibodies or convalescent plasma as part of COVID-19 treatment. Based on the estimated half-life of such therapies as well as evidence suggesting that reinfection is uncommon in the 90 days after initial infection, vaccination should be deferred for at least 90 days, as a precautionary measure until additional information becomes available, to avoid interference of the antibody treatment with vaccine-induced immune responses. cdc.gov/coronavirus 11/21/2018 This information is not intended to replace advice given to you by your health care provider. Make sure you discuss any questions you have with your health care provider. Document Revised: 04/25/2019 Document Reviewed: 04/25/2019 Elsevier Patient Education  2020 Elsevier Inc.  

## 2020-02-24 NOTE — Progress Notes (Signed)
Patient ID: Anna Cameron, female   DOB: 05-22-1976, 44 y.o.   MRN: [id number]  Diagnosis: COVID-19  Physician:jones  Procedure: [facility] Infusion [facility] Med: casirivimab\imdevimab infusion - Provided patient with casirivimab\imdevimab fact sheet for patients, parents and caregivers prior to infusion.  Complications: No immediate complications noted.  Discharge: Discharged home   Annice Pih 02/24/2020

## 2020-02-26 LAB — CALR + JAK2 E12-15 + MPL (REFLEXED)

## 2020-02-26 LAB — JAK2 V617F, W REFLEX TO CALR/E12/MPL

## 2020-03-02 ENCOUNTER — Ambulatory Visit: Payer: 59 | Admitting: Hematology and Oncology

## 2020-03-04 ENCOUNTER — Ambulatory Visit: Payer: 59 | Admitting: Family Medicine

## 2020-03-05 NOTE — Progress Notes (Signed)
[facility]  [address], [address] [place], [place] [address] Phone: [phone]  Fax: [phone]   Clinic Day:  03/09/2020  Referring physician: Duanne LimerickJones, Deanna C, MD  Chief Complaint: Anna Cameron is a 44 y.o. female with erythrocytosis who is seen for assessment, review of work-up, and discussion regarding direction of therapy.  HPI: The patient was last seen in the hematology clinic on 02/19/2020 for new patient assessment.  She smoked 1 1/2 packs/day.  She was felt to possibly have sleep apnea.  Symptomatically, she felt "ok".  She noted headaches but no erythromyalgia.  She denied any B symptoms.  She denied any history of thrombosis.   CXR on 02/19/2020 revealed no active cardiopulmonary disease.  Work-up revealed a hematocrit of 46.8, hemoglobin 16.3, platelets 255,000, WBC 7,400. Ferritin was 59. Erythropoietin was 8.7 (2.6-18.5). Carbon monoxide was 7.3% (high). JAK2 V617F, exons 12-15, CALR, and MPL mutation were negative. Urinlaysis revealed no hematuria.  The patient tested positive for COVID-19 on 02/20/2020. She received a casirivimab\imdevimab infusion on 02/24/2020.  During the interim, she has been "good." All of her symptoms are the same. She would like to do overnight oximetry testing with Dr. Yetta BarreJones. She will consider the smoking cessation program and would like them to call her.   The would like to start a phlebotomy trial today.   Past Medical History:  Diagnosis Date  . Allergy     Past Surgical History:  Procedure Laterality Date  . ABDOMINAL HYSTERECTOMY    . CESAREAN SECTION     x 3  . CYSTOSCOPY WITH INJECTION N/A 09/05/2019   Procedure: CYSTOSCOPY WITH INJECTION BLADDER BOTOX;  Surgeon: Orson ApeWolff, Michael R, MD;  Location: [facility] ORS;  Service: Urology;  Laterality: N/A;    Family History  Problem Relation Age of Onset  . Cancer Father   . Cancer Maternal Grandmother     Social History:  reports that she has been  smoking cigarettes. She has been smoking about 1.50 packs per day. She has never used smokeless tobacco. She reports previous alcohol use. She reports that she does not use drugs. She smokes a pack and a half daily and started when she was 16. She denies alcohol use. She denies known exposure to radiation or toxins. 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 The patient is alone today.  Allergies: No Known Allergies  Current Medications: Current Outpatient Medications  Medication Sig Dispense Refill  . fexofenadine (ALLEGRA) 180 MG tablet Take 180 mg by mouth daily.    Marland Kitchen. FLUoxetine (PROZAC) 20 MG tablet Take 1 tablet (20 mg total) by mouth daily. 90 tablet 1  . hydrochlorothiazide (HYDRODIURIL) 12.5 MG tablet Take 1 tablet (12.5 mg total) by mouth daily. 90 tablet 1  . Multiple Vitamin (MULTIVITAMIN WITH MINERALS) TABS tablet Take 1 tablet by mouth daily.    . vitamin B-12 (CYANOCOBALAMIN) 50 MCG tablet Take 100 mcg by mouth daily.    Marland Kitchen. IRON-B12-VITAMINS PO Take by mouth. (Patient not taking: Reported on 02/19/2020)     No current facility-administered medications for this visit.    Review of Systems  Constitutional: Positive for diaphoresis and malaise/fatigue (x years). Negative for chills, fever and weight loss (stable).  HENT: Negative for congestion, ear discharge, ear pain, hearing loss, nosebleeds, sinus pain, sore throat and tinnitus.   Eyes: Negative for blurred vision.  Respiratory: Negative for cough, hemoptysis, sputum production and shortness of breath.   Cardiovascular: Negative for chest pain,  palpitations and leg swelling.  Gastrointestinal: Negative for abdominal pain, blood in stool, constipation, diarrhea, heartburn, melena, nausea and vomiting.  Genitourinary: Positive for frequency. Negative for dysuria, hematuria and urgency.  Musculoskeletal: Negative for back pain, joint pain, myalgias and neck pain.  Skin: Negative for itching and rash.    Neurological: Positive for headaches. Negative for dizziness, tingling, sensory change and weakness.  Endo/Heme/Allergies: Does not bruise/bleed easily.  Psychiatric/Behavioral: Negative for depression and memory loss. The patient is not nervous/anxious and does not have insomnia.   All other systems reviewed and are negative.  Performance status (ECOG): 1  Vitals Blood pressure 125/81, pulse 80, temperature 98.2 F (36.8 C), temperature source Tympanic, weight 183 lb 6.8 oz (83.2 kg), SpO2 100 %.   Physical Exam Vitals and nursing note reviewed.  Constitutional:      General: She is not in acute distress.    Appearance: She is not diaphoretic.  HENT:     Head: Normocephalic and atraumatic.     Comments: Long black hair. Eyes:     General: No scleral icterus.    Conjunctiva/sclera: Conjunctivae normal.  Cardiovascular:     Rate and Rhythm: Normal rate and regular rhythm.     Heart sounds: Normal heart sounds.  Pulmonary:     Effort: Pulmonary effort is normal. No respiratory distress.     Breath sounds: Normal breath sounds. No wheezing, rhonchi or rales.  Neurological:     Mental Status: She is alert and oriented to person, place, and time.  Psychiatric:        Behavior: Behavior normal.        Thought Content: Thought content normal.        Judgment: Judgment normal.    No visits with results within 3 Day(s) from this visit.  Latest known visit with results is:  Clinical Support on 02/19/2020  Component Date Value Ref Range Status  . JAK2 GenotypR 02/19/2020 Comment   Final   Comment: (NOTE) Result: NEGATIVE for the JAK2 V617F mutation. Interpretation:  The G to T nucleotide change encoding the V617F mutation was not detected.  This result does not rule out the presence of the JAK2 mutation at a level below the sensitivity of detection of this assay, or the presence of other mutations within JAK2 not detected by this assay.  This result does not rule out  a diagnosis of polycythemia vera, essential thrombocythemia or idiopathic myelofibrosis as the V617F mutation is not detected in all patients with these disorders.   Marland Kitchen BACKGROUND: 02/19/2020 Comment   Final   Comment: (NOTE) JAK2 is a cytoplasmic tyrosine kinase with a key role in signal transduction from multiple hematopoietic growth factor receptors. A point mutation within exon 14 of the JAK2 gene (M0947S) encoding a valine to phenylalanine substitution at position 617 of the JAK2 protein (V617F) has been identified in most patients with polycythemia vera, and in about half of those with either essential thrombocythemia or idiopathic myelofibrosis. The V617F has also been detected, although infrequently, in other myeloid disorders such as chronic myelomonocytic leukemia and chronic neutrophilic luekemia. V617F is an acquired mutation that alters a highly conserved valine present in the negative regulatory JH2 domain of the JAK2 protein and is predicted to dysregulate kinase activity. Methodology: Total genomic DNA was extracted and subjected to TaqMan real-time PCR amplification/detection. Two amplification products per sample were monitored by real-time PCR using primers/probes s  pecific to JAK2 wild type (WT) and JAK2 mutant V617F. The ABI7900 Absolute Quantitation software will compare the patient specimen valuse to the standard curves and generate percent values for wild type and mutant type. In vitro studies have indicated that this assay has an analytical sensitivity of 1%. References: Baxter EJ, Scott Lenoria Farrier, et al. Acquired mutation of the tyrosine kinase JAK2 in human myeloproliferative disorders. Lancet. 2005 Mar 19-25; 365(9464):1054-1061. Milus Banister Couedic JP. A unique clonal JAK2 mutation leading to constitutive signaling causes polycythaemia vera. Nature. 2005 Apr 28; 434(7037):1144-1148. Kralovics R, Passamonti F, Buser  AS, et al. A gain-of-function mutation of JAK2 in myeloproliferative disorders. N Engl J Med. 2005 Apr 28; 352(17):1779-1790.   . Director Review, JAK2 02/19/2020 Comment   Final   Comment: (NOTE) Maurine Simmering, MD, PhD Director, Molecular Oncology [facility] [place], [place] [address] [phone] This test was developed and its performance characteristics determined by [facility]. It has not been cleared or approved by the Food and Drug Administration.   Marland Kitchen REFLEX: 02/19/2020 Comment   Final   Comment: (NOTE) Reflex to CALR Mutation Analysis, JAK2 Exon 12-15 Mutation Analysis, and MPL Mutation Analysis is indicated.   Marland Kitchen Extraction 02/19/2020 Completed   Corrected   Comment: (NOTE) Performed At: [facility] [address] [place], [place] [id number] Maurine Simmering MDPhD OE:[id number] Performed At: [facility] [address] [place], [place] [id number] Maurine Simmering MDPhD PP:[id number]   . Color, Urine 02/19/2020 YELLOW  YELLOW Final  . APPearance 02/19/2020 CLEAR  CLEAR Final  . Specific Gravity, Urine 02/19/2020 <1.005* 1.005 - 1.030 Final  . pH 02/19/2020 5.0  5.0 - 8.0 Final  . Glucose, UA 02/19/2020 NEGATIVE  NEGATIVE mg/dL Final  . Hgb urine dipstick 02/19/2020 NEGATIVE  NEGATIVE Final  . Bilirubin Urine 02/19/2020 NEGATIVE  NEGATIVE Final  . Ketones, ur 02/19/2020 NEGATIVE  NEGATIVE mg/dL Final  . Protein, ur 58/01/9832 NEGATIVE  NEGATIVE mg/dL Final  . Nitrite 82/50/5397 NEGATIVE  NEGATIVE Final  . Glori Luis 02/19/2020 NEGATIVE  NEGATIVE Final  . Squamous Epithelial / LPF 02/19/2020 0-5  0 - 5 Final  . WBC, UA 02/19/2020 0-5  0 - 5 WBC/hpf Final  . RBC / HPF 02/19/2020 0-5  0 - 5 RBC/hpf Final  . Bacteria, UA 02/19/2020 FEW* NONE SEEN Final   Performed at [facility], [address], [place], [place] [address]  . Carbon Monoxide, Blood 02/19/2020 7.3* 0.0 - 3.6 % Final   Comment: (NOTE)                             Environmental Exposure:                             Nonsmokers           <3.7                             Smokers              <9.9                            Occupational Exposure:  BEI                   3.5                                Detection Limit =  0.2 Performed At: [facility] [address] [place], [place] [id number] Jolene Schimke MD DG:[id number]   . Ferritin 02/19/2020 59  11 - 307 ng/mL Final   Performed at [facility], [address] [place], [place] [address]  . Erythropoietin 02/19/2020 8.7  2.6 - 18.5 mIU/mL Final   Comment: (NOTE) Beckman Coulter UniCel DxI 800 Immunoassay System Values obtained with different assay methods or kits cannot be used interchangeably. Results cannot be interpreted as absolute evidence of the presence or absence of malignant disease. Performed At: [facility] [address] [place], [place] [id number] Jolene Schimke MD JO:[id number]   . WBC 02/19/2020 7.4  4.0 - 10.5 K/uL Final  . RBC 02/19/2020 5.08  3.87 - 5.11 MIL/uL Final  . Hemoglobin 02/19/2020 16.3* 12.0 - 15.0 g/dL Final  . HCT 60/02/9322 46.8* 36 - 46 % Final  . MCV 02/19/2020 92.1  80.0 - 100.0 fL Final  . MCH 02/19/2020 32.1  26.0 - 34.0 pg Final  . MCHC 02/19/2020 34.8  30.0 - 36.0 g/dL Final  . RDW 55/73/2202 12.4  11.5 - 15.5 % Final  . Platelets 02/19/2020 255  150 - 400 K/uL Final  . nRBC 02/19/2020 0.0  0.0 - 0.2 % Final  . Neutrophils Relative % 02/19/2020 68  % Final  . Neutro Abs 02/19/2020 5.0  1.7 - 7.7 K/uL Final  . Lymphocytes Relative 02/19/2020 16  % Final  . Lymphs Abs 02/19/2020 1.2  0.7 - 4.0 K/uL Final  . Monocytes Relative 02/19/2020 13  % Final  . Monocytes Absolute 02/19/2020 1.0  0.1 - 1.0 K/uL Final  . Eosinophils Relative 02/19/2020 2  % Final  . Eosinophils Absolute 02/19/2020 0.1  0.0 - 0.5 K/uL Final  . Basophils Relative 02/19/2020 1  % Final  . Basophils Absolute  02/19/2020 0.1  0.0 - 0.1 K/uL Final  . Immature Granulocytes 02/19/2020 0  % Final  . Abs Immature Granulocytes 02/19/2020 0.02  0.00 - 0.07 K/uL Final   Performed at [facility], [address], [place], [place] [address]  . CALR Mutation Detection Result 02/19/2020 Comment   Final   Comment: (NOTE) NEGATIVE No insertions or deletions were detected within the analyzed region of the calreticulin (CALR) gene. A negative result does not entirely exclude the possibility of a clonal population carrying CALR gene mutations that are not covered by this assay. Results should be interpreted in conjunction with clinical and laboratory findings for the most accurate interpretation.   . Background: 02/19/2020 Comment   Final   Comment: (NOTE) The calcium-binding endoplasmic reticulin chaperone protein, calreticulin (CALR), is somatically mutated in approximately 70% of patients with JAK2-negative essential thrombocythemia (ET) and 60- 88% of patients with JAK2-negative primary myelofibrosis(PMF). Only a minority of patients (approximately 8%) with myelodysplasia have mutations in  CALR gene. CALR mutations are rarely detected in patients with de novo acute myeloid leukemia, chronic myelogenous leukemia, lymphoid leukemia, or solid tumors. CALR mutations are not detected in polycythemia and generally appear to be mutually exclusive with JAK2 mutations and MPL mutations. The majority of mutational changes involve a variety of insertion or deletion mutations in exon 9  of the calreticulin gene: approximately 53% of all CALR mutations are a 52 bp deletion (type-1) while the second most prevalent mutation (approximately 32%) contains a 5 bp insertion (type-2). Other mutations (non-type 1 or type 2) are seen                           in a small minority of cases. CALR mutations in PMF tend to be associated with a favorable prognosis compared to JAK2 V617F mutations, whereas primary  myelofibrosis negative for CALR, JAK2 V617F and MPL mutations (so-called triple negative) is associated with a poor prognosis and shorter survival. The detection of a CALR gene mutation aids in the specific diagnosis of a myeloproliferative neoplasm, and help distinguish this clonal disease from a benign reactive process.   . Methodology: 02/19/2020 Comment   Final   Comment: (NOTE) Genomic DNA was isolated from the provided specimen. Polymerase chain reaction (PCR) of exon 9 of the CALR gene was performed with specific fluorescent-labeled primers, and the PCR product was analyzed by capillary gel electrophoresis to determine the size of the PCR products. This PCR assay is capable of detecting a mutant cell population with a sensitivity of 5 mutant cells per 100 normal cells. A negative result does not exclude the presence of a myeloproliferative disorder or other neoplastic process. This test was developed and its performance characteristics determined by [facility]. It has not been cleared or approved by the Food and Drug Administration. The FDA has determined that such clearance or approval is not necessary.   . References: 02/19/2020 Comment   Final   Comment: (NOTE) 1. Klampfel, T. et al. (2013) Somatic mutations of calreticulin in   myeloproliferative neoplasms. New Engl. J. Med. 161:0960-4540. 2. Gerre Couch et al. (2013) Somatic CALR mutations in   myeloproliferative neoplasms with nonmutated JAK2. New Engl. J.   Med. [phone].   Marland Kitchen Director Review 02/19/2020 Comment   Final   Comment: (NOTE) Mellody Life, PhD, [facility]    Director, Molecular Oncology    [facility]    [address] [place], [place] [address]    [phone]   . JAK2 Exons 12-15 Mut Det PCR: 02/19/2020 Comment   Final   Comment: (NOTE) NEGATIVE JAK2 mutations were not detected in exons 12, 13, 14 and 15. This result does not rule out the presence of JAK2 mutation at a  level below the detection sensitivity of this assay, the presence of other mutations outside the analyzed region of the JAK2 gene, or the presence of a myeloproliferative or other neoplasm. Result must be correlated with other clinical data for the most accurate diagnosis.   Marland Kitchen BACKGROUND: 02/19/2020 Comment   Final   Comment: (NOTE) JAK2 V617F mutation is detected in patients with polycythemia vera (95%), essential thrombocythemia (50%) and primary myelofibrosis (50%). A small percentage of JAK2 mutation positive patients (3.3%) contain other non-V617F mutations within exons 12 to 15. The detection of a JAK2 gene mutation aids in the specific diagnosis of a myeloproliferative neoplasm, and help distinguish this clonal disease from a benign reactive process.   . Method 02/19/2020 Comment   Final   Comment: (NOTE) Total RNA was purified from the provided specimen. The JAK2 gene region covering exons 12 to 15 was subjected to reverse- transcription coupled PCR amplification, and bi-directional sequencing to identify sequence variations. This assay has a sensitivity to detect approximately 15% population of cells containing the JAK2 mutations in a background  of non-mutant cells. This test was developed and its performance characteristics determined by [facility]. It has not been cleared or approved by the Food and Drug Administration.   . References 02/19/2020 Comment   Final   Comment: (NOTE) Algasham, N. et al. Detection of mutations in JAK2 exons 12-15 by Sanger sequencing. Int J Lab Hemato. 2015, 38:34-41. Garlon Hatchet al. Mutation profile of JAK2 transcripts in patients with chronic myeloproliferative neoplasias. J Mol Diagn. 2009, 11:49-53.   Marland Kitchen DIRECTOR REVIEW: 02/19/2020 Comment   Final   Comment: (NOTE) Mellody Life, PhD, [facility]    Director, Molecular Oncology    [facility]    [address] [place], [place] [address]    [phone]   . MPL  MUTATION ANALYSIS RESULT: 02/19/2020 Comment   Final   Comment: (NOTE) No MPL mutation was identified in the provided specimen of this individual. Results should be interpreted in conjunction with clinical and other laboratory findings for the most accurate interpretation.   Marland Kitchen BACKGROUND: 02/19/2020 Comment   Final   Comment: (NOTE) MPL (myeloproliferative leukemia virus oncogene homology) belongs to the hematopoietin superfamily and enables its ligand thrombopoietin to facilitate both global hematopoiesis and megakaryocyte growth and differentiation. MPL W515 mutations are present in patients with primary myelofibrosis (PMF) and essential thrombocythemia (ET) at a frequency of approximately 5% and 1% respectively. The S505 mutation is detected in patients with hereditary thrombocythemia.   Marland Kitchen METHODOLOGY: 02/19/2020 Comment   Final   Comment: (NOTE) Genomic DNA was purified from the provided specimen. MPL gene region covering the S505N and W515L/K mutations were subjected to PCR amplification and bi-directional sequencing in duplicate to identify sequence variations. This assay has a sensitivity to detect approximately 20-25% population of cells containing the MPL mutations in a background of non-mutant cells. This assay will not detect the mutation below the sensitivity of this assay. Molecular- based testing is highly accurate, but as in any laboratory test, rare diagnostic errors may occur.   Marland Kitchen REFERENCES: 02/19/2020 Comment   Final   Comment: (NOTE) 1. Pardanani AD, et al. (2006). MPL515 mutations in   myeloproliferative and other myeloid disorders: a study   of 1182 patients. Blood 147:8295-6213. 2. Gavin Pound and Levine RL. (2008). JAK2 and MPL   mutations in myeloproliferative neoplasms: discovery and   science. Leukemia 22:1813-1817. 3. Toribio Harbour, et al. (2009). Evidence for a founder effect   of the MPL-S505N mutation in eight [place] pedigrees with   hereditary  thrombocythemia. Haematologica 94(10):1368-   1374.   Marland Kitchen DIRECTOR REVIEW: 02/19/2020 Comment   Final   Comment: (NOTE) Rushie Chestnut, PhD, [facility]   Genomics Associate Technical Director, Clinical Cytogenetics   Center for [facility] / Pathology   [facility]   [address] [place],   [place], [place] [address]   [phone] This test was developed and its performance characteristics determined by [facility]. It has not been cleared or approved by the Food and Drug Administration.   . Extraction 02/19/2020 Comment   Final   Comment: (NOTE) This sample has been received and DNA extraction has been performed. Performed At: [facility] [address] [place], [place] [id number] Maurine Simmering MDPhD VO:[id number] Performed At: [facility] [address] [place], [place] [id number] Maurine Simmering MDPhD FI:[id number]     Assessment:  Anna Cameron is a 44 y.o. female with erythrocytosis.  She smokes 1 1/2 packs per day.  She does not have a carbon monoxide monitor at home, but does not feel less fatigued/lethargic when she leaves her house. She does not use any hormonal injections.  She may have sleep apnea.  Work-up on 02/19/2020 revealed a hematocrit of 46.8, hemoglobin 16.3, platelets 255,000, WBC 7,400. Ferritin was 59. Erythropoietin was 8.7 (2.6-18.5). Carbon monoxide was 7.3% (high). JAK2 V617F, exons 12-15, CALR, and MPL mutation were negative. Urinlaysis revealed no hematuria.  CXR on 02/19/2020 revealed no active cardiopulmonary disease.  She received the Pfizer COVID-19 vaccine on 08/30/2019 and 09/20/2019.  She tested positive for COVID-19 on 02/20/2020. She received a casirivimab\imdevimab infusion on 02/24/2020.  Symptomatically, she has been fatigued for years.  She has headaches.  Exam is unremarkable.  Plan: 1.   Labs today:  Hematocrit/hemoglobin. 2.   [name]  Discuss work-up.  No  evidence of a myeloproliferative disorder.  She has an elevated carboxyhemoglobin secondary to smoking.   Encourage smoking cessation.   She is interested in the smoking cessation program.  She may also have sleep apnea.   Discuss testing with Dr Yetta Barre' office.  Discuss initiation of a phlebotomy trial to assess improvement in fatigue and headaches.  Phlebotomy 250 cc if hemoglobin > 15. 3.   Contact the smoking cessation clinic. 4.   RTC in 2 weeks for labs (HCT/Hgb) and +/- phlebotomy. 5.   RTC in 4 weeks for labs (HCT/Hgb) and +/- phlebotomy. 6.   RTC in 6 weeks for MD assess, labs (CBC with diff) and +/- phlebotomy.  I discussed the assessment and treatment plan with the patient.  The patient was provided an opportunity to ask questions and all were answered.  The patient agreed with the plan and demonstrated an understanding of the instructions.  The patient was advised to call back if the symptoms worsen or if the condition fails to improve as anticipated.   Anna C. Merlene Pulling, MD, PhD    03/09/2020, 3:45 PM  I, Danella Penton Tufford, am acting as Neurosurgeon for [employer]. Merlene Pulling, MD, PhD.  I, Anna C. Merlene Pulling, MD, have reviewed the above documentation for accuracy and completeness, and I agree with the above.

## 2020-03-09 ENCOUNTER — Inpatient Hospital Stay: Payer: 59 | Attending: Hematology and Oncology | Admitting: Hematology and Oncology

## 2020-03-09 ENCOUNTER — Other Ambulatory Visit: Payer: Self-pay

## 2020-03-09 ENCOUNTER — Inpatient Hospital Stay: Payer: 59

## 2020-03-09 ENCOUNTER — Encounter: Payer: Self-pay | Admitting: Hematology and Oncology

## 2020-03-09 VITALS — BP 125/81 | HR 80 | Temp 98.2°F | Wt 183.4 lb

## 2020-03-09 DIAGNOSIS — Z90710 Acquired absence of both cervix and uterus: Secondary | ICD-10-CM | POA: Insufficient documentation

## 2020-03-09 DIAGNOSIS — Z8616 Personal history of COVID-19: Secondary | ICD-10-CM | POA: Insufficient documentation

## 2020-03-09 DIAGNOSIS — F17210 Nicotine dependence, cigarettes, uncomplicated: Secondary | ICD-10-CM | POA: Insufficient documentation

## 2020-03-09 DIAGNOSIS — D751 Secondary polycythemia: Secondary | ICD-10-CM

## 2020-03-09 DIAGNOSIS — Z79899 Other long term (current) drug therapy: Secondary | ICD-10-CM | POA: Insufficient documentation

## 2020-03-09 LAB — HEMOGLOBIN AND HEMATOCRIT, BLOOD
HCT: 42.1 % (ref 36.0–46.0)
Hemoglobin: 14.6 g/dL (ref 12.0–15.0)

## 2020-03-09 NOTE — Progress Notes (Signed)
No new changes noted today 

## 2020-03-10 ENCOUNTER — Encounter: Payer: Self-pay | Admitting: Family Medicine

## 2020-03-10 ENCOUNTER — Ambulatory Visit: Payer: 59 | Admitting: Family Medicine

## 2020-03-10 VITALS — BP 120/78 | HR 72 | Ht 66.5 in | Wt 184.0 lb

## 2020-03-10 DIAGNOSIS — Z23 Encounter for immunization: Secondary | ICD-10-CM

## 2020-03-10 DIAGNOSIS — D582 Other hemoglobinopathies: Secondary | ICD-10-CM | POA: Diagnosis not present

## 2020-03-10 DIAGNOSIS — R030 Elevated blood-pressure reading, without diagnosis of hypertension: Secondary | ICD-10-CM

## 2020-03-10 DIAGNOSIS — R601 Generalized edema: Secondary | ICD-10-CM | POA: Diagnosis not present

## 2020-03-10 DIAGNOSIS — R5383 Other fatigue: Secondary | ICD-10-CM

## 2020-03-10 DIAGNOSIS — G4739 Other sleep apnea: Secondary | ICD-10-CM

## 2020-03-10 MED ORDER — AMLODIPINE BESYLATE 2.5 MG PO TABS: 2.5000 mg | ORAL_TABLET | Freq: Every day | ORAL | 0 refills | Status: DC

## 2020-03-10 NOTE — Progress Notes (Signed)
Date:  03/10/2020   Name:  Anna Cameron   DOB:  [birth year]   MRN:  [id number]   Chief Complaint: face to face (sleep apnea?- Has been advised to have sleep study per Dr Junita Push)  Hypertension This is a chronic problem. The current episode started more than 1 year ago. The problem has been gradually improving since onset. The problem is controlled. Pertinent negatives include no anxiety, blurred vision, chest pain, headaches, malaise/fatigue, neck pain, orthopnea, palpitations, peripheral edema, PND, shortness of breath or sweats. There are no associated agents to hypertension. Risk factors for coronary artery disease include smoking/tobacco exposure. Past treatments include diuretics. The current treatment provides moderate improvement. There are no compliance problems.  There is no history of angina, kidney disease, CAD/MI, CVA, heart failure, left ventricular hypertrophy, PVD or retinopathy. Identifiable causes of hypertension include a thyroid problem. There is no history of chronic renal disease, a hypertension causing med or renovascular disease.  Thyroid Problem Patient reports no anxiety, cold intolerance, constipation, diarrhea, fatigue, heat intolerance, menstrual problem or palpitations. There is no history of heart failure.    Lab Results  Component Value Date   CREATININE 0.70 01/24/2020   BUN 14 01/24/2020   NA 143 01/24/2020   K 3.6 01/24/2020   CL 102 01/24/2020   CO2 25 01/24/2020   No results found for: CHOL, HDL, LDLCALC, LDLDIRECT, TRIG, CHOLHDL Lab Results  Component Value Date   TSH 2.210 01/24/2020   No results found for: HGBA1C Lab Results  Component Value Date   WBC 7.4 02/19/2020   HGB 14.6 03/09/2020   HCT 42.1 03/09/2020   MCV 92.1 02/19/2020   PLT 255 02/19/2020   Lab Results  Component Value Date   ALT 20 01/24/2020   AST 21 01/24/2020   ALKPHOS 71 01/24/2020   BILITOT <0.2 01/24/2020     Review of Systems  Constitutional: Negative.   Negative for chills, fatigue, fever, malaise/fatigue and unexpected weight change.  HENT: Negative for congestion, ear discharge, ear pain, rhinorrhea, sinus pressure, sneezing and sore throat.   Eyes: Negative for blurred vision, photophobia, pain, discharge, redness and itching.  Respiratory: Negative for cough, shortness of breath, wheezing and stridor.   Cardiovascular: Negative for chest pain, palpitations, orthopnea and PND.  Gastrointestinal: Negative for abdominal pain, blood in stool, constipation, diarrhea, nausea and vomiting.  Endocrine: Negative for cold intolerance, heat intolerance, polydipsia, polyphagia and polyuria.  Genitourinary: Negative for dysuria, flank pain, frequency, hematuria, menstrual problem, pelvic pain, urgency, vaginal bleeding and vaginal discharge.  Musculoskeletal: Negative for arthralgias, back pain, myalgias and neck pain.  Skin: Negative for rash.  Allergic/Immunologic: Negative for environmental allergies and food allergies.  Neurological: Negative for dizziness, weakness, light-headedness, numbness and headaches.  Hematological: Negative for adenopathy. Does not bruise/bleed easily.  Psychiatric/Behavioral: Negative for dysphoric mood. The patient is not nervous/anxious.     Patient Active Problem List   Diagnosis Date Noted  . [name] 02/19/2020    No Known Allergies  Past Surgical History:  Procedure Laterality Date  . ABDOMINAL HYSTERECTOMY    . CESAREAN SECTION     x 3  . CYSTOSCOPY WITH INJECTION N/A 09/05/2019   Procedure: CYSTOSCOPY WITH INJECTION BLADDER BOTOX;  Surgeon: Orson Ape, MD;  Location: [facility]S;  Service: Urology;  Laterality: N/A;    Social History   Tobacco Use  . Smoking status: Current Every Day Smoker    Packs/day: 1.50    Types: Cigarettes  . Smokeless tobacco:  Never Used  Substance Use Topics  . Alcohol use: Not Currently  . Drug use: Never     Medication list has been reviewed and  updated.  Current Meds  Medication Sig  . ferrous sulfate 325 (65 FE) MG tablet Take 325 mg by mouth daily with breakfast.  . fexofenadine (ALLEGRA) 180 MG tablet Take 180 mg by mouth daily.  Marland Kitchen FLUoxetine (PROZAC) 20 MG tablet Take 1 tablet (20 mg total) by mouth daily.  . hydrochlorothiazide (HYDRODIURIL) 12.5 MG tablet Take 1 tablet (12.5 mg total) by mouth daily.  . Multiple Vitamin (MULTIVITAMIN WITH MINERALS) TABS tablet Take 1 tablet by mouth daily.  . vitamin B-12 (CYANOCOBALAMIN) 1000 MCG tablet Take 1,000 mcg by mouth daily.   . [DISCONTINUED] IRON-B12-VITAMINS PO Take by mouth.    PHQ 2/9 Scores 01/24/2020 12/03/2019  PHQ - 2 Score 2 4  PHQ- 9 Score 10 9    GAD 7 : Generalized Anxiety Score 01/24/2020 12/03/2019  Nervous, Anxious, on Edge 0 0  Control/stop worrying 0 0  Worry too much - different things 0 2  Trouble relaxing 0 2  Restless 0 0  Easily annoyed or irritable 3 1  Afraid - awful might happen 0 0  Total GAD 7 Score 3 5  Anxiety Difficulty Not difficult at all Not difficult at all    BP Readings from Last 3 Encounters:  03/10/20 120/78  03/09/20 125/81  02/24/20 137/81    Physical Exam Vitals and nursing note reviewed.  Constitutional:      Appearance: She is well-developed.  HENT:     Head: Normocephalic.     Right Ear: Tympanic membrane, ear canal and external ear normal. There is no impacted cerumen.     Left Ear: Tympanic membrane, ear canal and external ear normal. There is no impacted cerumen.     Nose: Nose normal. No congestion or rhinorrhea.     Mouth/Throat:     Mouth: Mucous membranes are moist.  Eyes:     General: Lids are everted, no foreign bodies appreciated. No scleral icterus.       Left eye: No foreign body or hordeolum.     Conjunctiva/sclera: Conjunctivae normal.     Right eye: Right conjunctiva is not injected.     Left eye: Left conjunctiva is not injected.     Pupils: Pupils are equal, round, and reactive to light.  Neck:      Thyroid: No thyromegaly.     Vascular: No JVD.     Trachea: No tracheal deviation.  Cardiovascular:     Rate and Rhythm: Normal rate and regular rhythm.     Heart sounds: Normal heart sounds. No murmur heard.  No friction rub. No gallop.   Pulmonary:     Effort: Pulmonary effort is normal. No respiratory distress.     Breath sounds: Normal breath sounds. No wheezing, rhonchi or rales.  Abdominal:     General: Bowel sounds are normal.     Palpations: Abdomen is soft. There is no mass.     Tenderness: There is no abdominal tenderness. There is no guarding or rebound.  Musculoskeletal:        General: No tenderness. Normal range of motion.     Cervical back: Normal range of motion and neck supple.  Lymphadenopathy:     Cervical: No cervical adenopathy.  Skin:    General: Skin is warm.     Findings: No rash.  Neurological:     Mental Status:  She is alert and oriented to person, place, and time.     Cranial Nerves: No cranial nerve deficit.     Deep Tendon Reflexes: Reflexes normal.  Psychiatric:        Mood and Affect: Mood is not anxious or depressed.     Wt Readings from Last 3 Encounters:  03/10/20 184 lb (83.5 kg)  03/09/20 183 lb 6.8 oz (83.2 kg)  02/19/20 183 lb 1.5 oz (83 kg)    BP 120/78   Pulse 72   Ht 5' 6.5" (1.689 m)   Wt 184 lb (83.5 kg)   BMI 29.25 kg/m   Assessment and Plan: 1. Elevated hemoglobin (HCC) Chronic.  Improved.  Stable.  However there is concern that this may be an ongoing factor.  Patient has been explained the risk of having an elevated erythrocytosis.  Currently it is in a normal range but high normal.  Patient's been encouraged to cut back on her smoking to no more than a pack a day.  Patient is also have been encouraged to drink fluids but to avoid sodium.  And as noted above we have made a change on her blood pressure medication. 2. Fatigue, unspecified type Patient continues to have daytime somnolence as well as overall fatigue and does  not awaken in the morning rested.  Her concern is that there may be a component of sleep apnea and that we will have this investigated because of the risk thereof.  3. Elevated blood-pressure reading without diagnosis of hypertension Chronic.  Controlled.  Stable.  However concerned about the diuretic contributing to hemoconcentration effect is a concern and therefore we will discontinue hydrochlorothiazide and replace with amlodipine 2.5 mg once a day.  We will recheck blood pressure in 3 months.  4. Generalized edema Generalized edema has resolved and we would discontinue hydrochlorothiazide.  5. Sleep apnea-like behavior Patient has no witnessed apnea and is uncertain whether she snores at night but I am concerned about a central sleep apnea component and will refer to ear nose and throat for evaluation of the possibility of sleep apnea. - Ambulatory referral to ENT

## 2020-03-23 ENCOUNTER — Other Ambulatory Visit: Payer: 59

## 2020-03-25 ENCOUNTER — Telehealth: Payer: Self-pay

## 2020-03-25 NOTE — Telephone Encounter (Signed)
Spoke to pt and gave her ENT telephone number. They tried to call her on 03/16/20 and left vm as well as on 10/29- couldn't leave a vm due to box being full. She is going to call ENT and schedule an appt. For eval sleep apnea

## 2020-03-31 ENCOUNTER — Telehealth: Payer: 59 | Admitting: Physician Assistant

## 2020-03-31 ENCOUNTER — Other Ambulatory Visit: Payer: Self-pay | Admitting: Physician Assistant

## 2020-03-31 DIAGNOSIS — H9201 Otalgia, right ear: Secondary | ICD-10-CM

## 2020-03-31 MED ORDER — NEOMYCIN-POLYMYXIN-HC 3.5-10000-1 OT SUSP: 4.0000 [drp] | Freq: Four times a day (QID) | OTIC | 0 refills | Status: DC

## 2020-03-31 MED ORDER — AMOXICILLIN-POT CLAVULANATE 875-125 MG PO TABS: 1.0000 | ORAL_TABLET | Freq: Two times a day (BID) | ORAL | 0 refills | Status: DC

## 2020-03-31 NOTE — Addendum Note (Signed)
Addended by: Dierdre Forth on: 03/31/2020 03:32 PM   Modules accepted: Orders

## 2020-03-31 NOTE — Progress Notes (Signed)
E Visit for Swimmer's Ear  We are sorry that you are not feeling well. Here is how we plan to help!  I have prescribed: Neomycin 0.35%, polymyxin B 10,000 units/mL, and hydrocortisone 0,5% otic solution 4 drops in affected ears four times a day for 7 days  Based on what you have told me you may have a bacterial infection. In addition to the ear drops I have prescribed an oral antibiotic: and Augmentin 875mg  one tablet by mouth twice a day for 10 days  In certain cases swimmer's ear may progress to a more serious bacterial infection of the middle or inner ear.  If you have a fever 102 and up and significantly worsening symptoms, this could indicate a more serious infection moving to the middle/inner and needs face to face evaluation in an office by a provider.  Your symptoms should improve over the next 3 days and should resolve in about 7 days.  HOME CARE:   Wash your hands frequently.  Do not place the tip of the bottle on your ear or touch it with your fingers.  You can take Acetominophen 650 mg every 4-6 hours as needed for pain.  If pain is severe or moderate, you can apply a heating pad (set on low) or hot water bottle (wrapped in a towel) to outer ear for 20 minutes.  This will also increase drainage.  Avoid ear plugs  Do not use Q-tips  After showers, help the water run out by tilting your head to one side.  GET HELP RIGHT AWAY IF:   Fever is over 102.2 degrees.  You develop progressive ear pain or hearing loss.  Ear symptoms persist longer than 3 days after treatment.  MAKE SURE YOU:   Understand these instructions.  Will watch your condition.  Will get help right away if you are not doing well or get worse.  TO PREVENT SWIMMER'S EAR:  Use a bathing cap or custom fitted swim molds to keep your ears dry.  Towel off after swimming to dry your ears.  Tilt your head or pull your earlobes to allow the water to escape your ear canal.  If there is still water in  your ears, consider using a hairdryer on the lowest setting.  Thank you for choosing an e-visit. Your e-visit answers were reviewed by a board certified advanced clinical practitioner to complete your personal care plan. Depending upon the condition, your plan could have included both over the counter or prescription medications. Please review your pharmacy choice. Be sure that the pharmacy you have chosen is open so that you can pick up your prescription now.  If there is a problem you may message your provider in MyChart to have the prescription routed to another pharmacy. Your safety is important to . If you have drug allergies check your prescription carefully.  For the next 24 hours, you can use MyChart to ask questions about today's visit, request a non-urgent call back, or ask for a work or school excuse from your e-visit provider. You will get an email in the next two days asking about your experience. I hope that your e-visit has been valuable and will speed your recovery.   Greater than 5 minutes, yet less than 10 minutes of time have been spent researching, coordinating, and implementing care for this patient today

## 2020-04-01 ENCOUNTER — Ambulatory Visit: Payer: 59 | Admitting: Family Medicine

## 2020-04-06 ENCOUNTER — Other Ambulatory Visit: Payer: 59

## 2020-04-09 DIAGNOSIS — R0683 Snoring: Secondary | ICD-10-CM | POA: Diagnosis not present

## 2020-04-09 DIAGNOSIS — Z720 Tobacco use: Secondary | ICD-10-CM | POA: Diagnosis not present

## 2020-04-20 ENCOUNTER — Inpatient Hospital Stay: Payer: 59 | Attending: Hematology and Oncology | Admitting: Hematology and Oncology

## 2020-04-20 ENCOUNTER — Inpatient Hospital Stay: Payer: 59

## 2020-06-10 ENCOUNTER — Encounter: Payer: Self-pay | Admitting: Family Medicine

## 2020-07-14 ENCOUNTER — Other Ambulatory Visit: Payer: Self-pay

## 2020-07-14 ENCOUNTER — Ambulatory Visit (INDEPENDENT_AMBULATORY_CARE_PROVIDER_SITE_OTHER): Payer: No Typology Code available for payment source | Admitting: Family Medicine

## 2020-07-14 ENCOUNTER — Encounter: Payer: Self-pay | Admitting: Family Medicine

## 2020-07-14 VITALS — BP 134/70 | HR 68 | Ht 66.5 in | Wt 191.0 lb

## 2020-07-14 DIAGNOSIS — R32 Unspecified urinary incontinence: Secondary | ICD-10-CM

## 2020-07-14 DIAGNOSIS — Z90710 Acquired absence of both cervix and uterus: Secondary | ICD-10-CM | POA: Diagnosis not present

## 2020-07-14 NOTE — Progress Notes (Signed)
Date:  07/14/2020   Name:  Anna Cameron   DOB:  02/20/1976   MRN:  [id number]   Chief Complaint: Urinary Frequency (Was having botox in bladder- needs referral to Dr Apolinar Junes)  Urinary Frequency  This is a chronic problem. The current episode started more than 1 year ago. The problem has been gradually worsening. The patient is experiencing no pain. Associated symptoms include frequency. Pertinent negatives include no chills, discharge, flank pain, hematuria, hesitancy, nausea, possible pregnancy, sweats, urgency or vomiting. She has tried nothing for the symptoms. The treatment provided moderate relief. There is no history of catheterization, kidney stones, recurrent UTIs, a single kidney, urinary stasis or a urological procedure.    Lab Results  Component Value Date   CREATININE 0.70 01/24/2020   BUN 14 01/24/2020   NA 143 01/24/2020   K 3.6 01/24/2020   CL 102 01/24/2020   CO2 25 01/24/2020   No results found for: CHOL, HDL, LDLCALC, LDLDIRECT, TRIG, CHOLHDL Lab Results  Component Value Date   TSH 2.210 01/24/2020   No results found for: HGBA1C Lab Results  Component Value Date   WBC 7.4 02/19/2020   HGB 14.6 03/09/2020   HCT 42.1 03/09/2020   MCV 92.1 02/19/2020   PLT 255 02/19/2020   Lab Results  Component Value Date   ALT 20 01/24/2020   AST 21 01/24/2020   ALKPHOS 71 01/24/2020   BILITOT <0.2 01/24/2020     Review of Systems  Constitutional: Negative.  Negative for chills, fatigue, fever and unexpected weight change.  HENT: Negative for congestion, ear discharge, ear pain, rhinorrhea, sinus pressure, sneezing and sore throat.   Eyes: Negative for double vision, photophobia, pain, discharge, redness and itching.  Respiratory: Negative for cough, shortness of breath, wheezing and stridor.   Gastrointestinal: Negative for abdominal pain, blood in stool, constipation, diarrhea, nausea and vomiting.  Endocrine: Negative for cold intolerance, heat intolerance,  polydipsia, polyphagia and polyuria.  Genitourinary: Positive for frequency. Negative for dysuria, flank pain, hematuria, hesitancy, menstrual problem, pelvic pain, urgency, vaginal bleeding and vaginal discharge.  Musculoskeletal: Negative for arthralgias, back pain and myalgias.  Skin: Negative for rash.  Allergic/Immunologic: Negative for environmental allergies and food allergies.  Neurological: Negative for dizziness, weakness, light-headedness, numbness and headaches.  Hematological: Negative for adenopathy. Does not bruise/bleed easily.  Psychiatric/Behavioral: Negative for dysphoric mood. The patient is not nervous/anxious.     Patient Active Problem List   Diagnosis Date Noted  . H/O total hysterectomy 07/14/2020  . [name] 02/19/2020    No Known Allergies  Past Surgical History:  Procedure Laterality Date  . ABDOMINAL HYSTERECTOMY    . CESAREAN SECTION     x 3  . CYSTOSCOPY WITH INJECTION N/A 09/05/2019   Procedure: CYSTOSCOPY WITH INJECTION BLADDER BOTOX;  Surgeon: Orson Ape, MD;  Location: [facility] OR[facility];  Service: Urology;  Laterality: N/A;    Social History   Tobacco Use  . Smoking status: Current Every Day Smoker    Packs/day: 1.50    Types: Cigarettes  . Smokeless tobacco: Never Used  Substance Use Topics  . Alcohol use: Not Currently  . Drug use: Never     Medication list has been reviewed and updated.  Current Meds  Medication Sig  . fexofenadine (ALLEGRA) 180 MG tablet Take 180 mg by mouth daily.  . Multiple Vitamin (MULTIVITAMIN WITH MINERALS) TABS tablet Take 1 tablet by mouth daily.  . Multiple Vitamins-Minerals (VITAMIN D3 COMPLETE PO) Take 1 tablet by  mouth daily before breakfast.  . vitamin B-12 (CYANOCOBALAMIN) 1000 MCG tablet Take 1,000 mcg by mouth daily.   . [DISCONTINUED] amLODipine (NORVASC) 2.5 MG tablet Take 1 tablet (2.5 mg total) by mouth daily.    PHQ 2/9 Scores 07/14/2020 01/24/2020 12/03/2019  PHQ - 2 Score 4 2 4   PHQ- 9  Score 11 10 9     GAD 7 : Generalized Anxiety Score 07/14/2020 01/24/2020 12/03/2019  Nervous, Anxious, on Edge 0 0 0  Control/stop worrying 0 0 0  Worry too much - different things 0 0 2  Trouble relaxing 0 0 2  Restless 0 0 0  Easily annoyed or irritable 1 3 1   Afraid - awful might happen 0 0 0  Total GAD 7 Score 1 3 5   Anxiety Difficulty Not difficult at all Not difficult at all Not difficult at all    BP Readings from Last 3 Encounters:  07/14/20 134/70  03/10/20 120/78  03/09/20 125/81    Physical Exam Vitals and nursing note reviewed.  Constitutional:      General: She is not in acute distress.    Appearance: She is not diaphoretic.  HENT:     Head: Normocephalic and atraumatic.     Right Ear: Tympanic membrane, ear canal and external ear normal. There is no impacted cerumen.     Left Ear: Tympanic membrane, ear canal and external ear normal. There is no impacted cerumen.     Nose: Nose normal. No congestion or rhinorrhea.     Mouth/Throat:     Mouth: Oropharynx is clear and moist.  Eyes:     General:        Right eye: No discharge.        Left eye: No discharge.     Extraocular Movements: Extraocular movements intact and EOM normal.     Conjunctiva/sclera: Conjunctivae normal.     Pupils: Pupils are equal, round, and reactive to light.  Neck:     Thyroid: No thyromegaly.     Vascular: No JVD.  Cardiovascular:     Rate and Rhythm: Normal rate and regular rhythm.     Pulses: Intact distal pulses.     Heart sounds: Normal heart sounds. No murmur heard. No friction rub. No gallop.   Pulmonary:     Effort: Pulmonary effort is normal.     Breath sounds: Normal breath sounds.  Abdominal:     General: Bowel sounds are normal.     Palpations: Abdomen is soft. There is no mass.     Tenderness: There is no abdominal tenderness. There is no guarding or rebound.  Musculoskeletal:        General: No edema. Normal range of motion.     Cervical back: Normal range of motion  and neck supple.  Lymphadenopathy:     Cervical: No cervical adenopathy.  Skin:    General: Skin is warm and dry.  Neurological:     Mental Status: She is alert.     Deep Tendon Reflexes: Reflexes are normal and symmetric.     Wt Readings from Last 3 Encounters:  07/14/20 191 lb (86.6 kg)  03/10/20 184 lb (83.5 kg)  03/09/20 183 lb 6.8 oz (83.2 kg)    BP 134/70   Pulse 68   Ht 5' 6.5" (1.689 m)   Wt 191 lb (86.6 kg)   BMI 30.37 kg/m   Assessment and Plan:  1. H/O total hysterectomy I suspect patient does not have a cervix but we  will recheck patient in 3 months and she is deciding whether or not to proceed with GYN referral for recheck.  2. Urinary incontinence in female Patient with history of urinary incontinence for which she received Botox last year with had excellent results last year.  We will refer to urology for Botox injection. - Ambulatory referral to Urology

## 2020-08-24 ENCOUNTER — Ambulatory Visit (INDEPENDENT_AMBULATORY_CARE_PROVIDER_SITE_OTHER): Payer: No Typology Code available for payment source | Admitting: Urology

## 2020-08-24 ENCOUNTER — Other Ambulatory Visit: Payer: Self-pay

## 2020-08-24 ENCOUNTER — Other Ambulatory Visit (HOSPITAL_BASED_OUTPATIENT_CLINIC_OR_DEPARTMENT_OTHER): Payer: Self-pay

## 2020-08-24 ENCOUNTER — Encounter: Payer: Self-pay | Admitting: Urology

## 2020-08-24 VITALS — BP 130/83 | HR 88 | Wt 190.0 lb

## 2020-08-24 DIAGNOSIS — R32 Unspecified urinary incontinence: Secondary | ICD-10-CM | POA: Diagnosis not present

## 2020-08-24 DIAGNOSIS — N3946 Mixed incontinence: Secondary | ICD-10-CM | POA: Diagnosis not present

## 2020-08-24 LAB — MICROSCOPIC EXAMINATION: Bacteria, UA: NONE SEEN

## 2020-08-24 LAB — URINALYSIS, COMPLETE
Bilirubin, UA: NEGATIVE
Glucose, UA: NEGATIVE
Ketones, UA: NEGATIVE
Leukocytes,UA: NEGATIVE
Nitrite, UA: NEGATIVE
Protein,UA: NEGATIVE
Specific Gravity, UA: 1.02 (ref 1.005–1.030)
Urobilinogen, Ur: 0.2 mg/dL (ref 0.2–1.0)
pH, UA: 5.5 (ref 5.0–7.5)

## 2020-08-24 LAB — BLADDER SCAN AMB NON-IMAGING: Scan Result: 0

## 2020-08-24 MED ORDER — CIPROFLOXACIN HCL 250 MG PO TABS
ORAL_TABLET | ORAL | 0 refills | Status: DC
  Filled 2020-08-24: qty 6, 3d supply, fill #0
  Filled 2020-08-25: qty 6, 2d supply, fill #0

## 2020-08-24 NOTE — Progress Notes (Signed)
08/24/2020 9:00 AM   Anna Cameron November 05, 1975 [id number]  Referring provider: Duanne Limerick, MD [address] [place],  [place] [address]  Chief Complaint  Patient presents with  . Urinary Incontinence    HPI: I was consulted to assess the patient's urinary incontinence.  She works here in the hospital.  She has urge incontinence.  She leaks with coughing sneezing.  She would leak with bending lifting her bladder is full.  She had a bladder suspension years ago.  She had Botox in April by another clinician and was nearly 100% dry.  She has no bedwetting.  She wears 2 pads a day moderately wet  She has had a hysterectomy.  She is prone to constipation.  She tried 3 medications in the past which might be Detrol Ditropan and Myrbetriq  No neurologic issues.  No infection history.  No kidney stone   PMH: Past Medical History:  Diagnosis Date  . Allergy     Surgical History: Past Surgical History:  Procedure Laterality Date  . ABDOMINAL HYSTERECTOMY    . CESAREAN SECTION     x 3  . CYSTOSCOPY WITH INJECTION N/A 09/05/2019   Procedure: CYSTOSCOPY WITH INJECTION BLADDER BOTOX;  Surgeon: Orson Ape, MD;  Location: [facility];  Service: Urology;  Laterality: N/A;    Home Medications:  Allergies as of 08/24/2020   No Known Allergies     Medication List       Accurate as of August 24, 2020  9:00 AM. If you have any questions, ask your nurse or doctor.        fexofenadine 180 MG tablet Commonly known as: ALLEGRA Take 180 mg by mouth daily.   multivitamin with minerals Tabs tablet Take 1 tablet by mouth daily.   neomycin-polymyxin-hydrocortisone 3.5-10000-1 OTIC suspension Commonly known as: CORTISPORIN PLACE 4 DROPS INTO THE RIGHT EAR 4 TIMES DAILY FOR 7 DAYS. DISCARD REMAINING MEDICATION.   vitamin B-12 1000 MCG tablet Commonly known as: CYANOCOBALAMIN Take 1,000 mcg by mouth daily.   VITAMIN D3 COMPLETE PO Take 1 tablet by mouth daily before  breakfast.       Allergies: No Known Allergies  Family History: Family History  Problem Relation Age of Onset  . Cancer Father   . Cancer Maternal Grandmother     Social History:  reports that she has been smoking cigarettes. She has been smoking about 1.50 packs per day. She has never used smokeless tobacco. She reports previous alcohol use. She reports that she does not use drugs.  ROS:                                        Physical Exam: There were no vitals taken for this visit.  Constitutional:  Alert and oriented, No acute distress. HEENT: [place] AT, moist mucus membranes.  Trachea midline, no masses. Cardiovascular: No clubbing, cyanosis, or edema. Respiratory: Normal respiratory effort, no increased work of breathing. GI: Abdomen is soft, nontender, nondistended, no abdominal masses GU: Grade 2 hypermobility the bladder neck and no stress incontinence.  Mild descensus of urethra at rest Skin: No rashes, bruises or suspicious lesions. Lymph: No cervical or inguinal adenopathy. Neurologic: Grossly intact, no focal deficits, moving all 4 extremities. Psychiatric: Normal mood and affect.  Laboratory Data: Lab Results  Component Value Date   WBC 7.4 02/19/2020   HGB 14.6 03/09/2020  HCT 42.1 03/09/2020   MCV 92.1 02/19/2020   PLT 255 02/19/2020    Lab Results  Component Value Date   CREATININE 0.70 01/24/2020    No results found for: PSA  No results found for: TESTOSTERONE  No results found for: HGBA1C  Urinalysis    Component Value Date/Time   COLORURINE YELLOW 02/19/2020 1518   APPEARANCEUR CLEAR 02/19/2020 1518   APPEARANCEUR Clear 09/19/2012 1805   LABSPEC <1.005 (L) 02/19/2020 1518   LABSPEC 1.003 09/19/2012 1805   PHURINE 5.0 02/19/2020 1518   GLUCOSEU NEGATIVE 02/19/2020 1518   GLUCOSEU Negative 09/19/2012 1805   HGBUR NEGATIVE 02/19/2020 1518   BILIRUBINUR NEGATIVE 02/19/2020 1518   BILIRUBINUR Negative 09/19/2012 1805    KETONESUR NEGATIVE 02/19/2020 1518   PROTEINUR NEGATIVE 02/19/2020 1518   NITRITE NEGATIVE 02/19/2020 1518   LEUKOCYTESUR NEGATIVE 02/19/2020 1518   LEUKOCYTESUR Negative 09/19/2012 1805    Pertinent Imaging: Urine reviewed.  Chart reviewed.  Post void residual 0 mL.  Assessment & Plan: Patient has mixed incontinence.  She did very well with Botox.  We explained the role of urodynamics but we decided not to get them at this stage.  We can always change course.  She wants another Botox the last one lasted 7 months.  At some point in time she might go to another treatment but so far is very pleased with it.  It will be scheduled as per protocol.  I gave her 3-day prescription of ciprofloxacin.  The other physician is out of network and she would prefer her care here for cost reasons and that we do them in the clinic  1. Urinary incontinence, unspecified type  - Urinalysis, Complete - BLADDER SCAN AMB NON-IMAGING   No follow-ups on file.  Anna Sinner, MD  [facility] [address] [place], [place] [address] [phone]

## 2020-08-25 ENCOUNTER — Other Ambulatory Visit (HOSPITAL_COMMUNITY): Payer: Self-pay

## 2020-08-25 ENCOUNTER — Other Ambulatory Visit: Payer: Self-pay

## 2020-08-26 ENCOUNTER — Other Ambulatory Visit: Payer: Self-pay

## 2020-09-14 ENCOUNTER — Other Ambulatory Visit: Payer: Self-pay

## 2020-09-14 ENCOUNTER — Other Ambulatory Visit: Payer: No Typology Code available for payment source

## 2020-09-14 DIAGNOSIS — R32 Unspecified urinary incontinence: Secondary | ICD-10-CM

## 2020-09-14 LAB — URINALYSIS, COMPLETE
Bilirubin, UA: NEGATIVE
Glucose, UA: NEGATIVE
Ketones, UA: NEGATIVE
Leukocytes,UA: NEGATIVE
Nitrite, UA: NEGATIVE
Protein,UA: NEGATIVE
RBC, UA: NEGATIVE
Specific Gravity, UA: 1.01 (ref 1.005–1.030)
Urobilinogen, Ur: 0.2 mg/dL (ref 0.2–1.0)
pH, UA: 5 (ref 5.0–7.5)

## 2020-09-14 LAB — MICROSCOPIC EXAMINATION

## 2020-09-17 LAB — CULTURE, URINE COMPREHENSIVE

## 2020-09-28 ENCOUNTER — Ambulatory Visit (INDEPENDENT_AMBULATORY_CARE_PROVIDER_SITE_OTHER): Payer: No Typology Code available for payment source | Admitting: Urology

## 2020-09-28 ENCOUNTER — Other Ambulatory Visit: Payer: Self-pay

## 2020-09-28 ENCOUNTER — Encounter: Payer: Self-pay | Admitting: Urology

## 2020-09-28 VITALS — BP 122/78 | HR 90

## 2020-09-28 DIAGNOSIS — N3946 Mixed incontinence: Secondary | ICD-10-CM

## 2020-09-28 MED ORDER — SODIUM CHLORIDE (PF) 0.9 % IJ SOLN
10.0000 mL | Freq: Once | INTRAMUSCULAR | Status: AC
  Administered 2020-09-28: 10 mL

## 2020-09-28 MED ORDER — ONABOTULINUMTOXINA 100 UNITS IJ SOLR
100.0000 [IU] | Freq: Once | INTRAMUSCULAR | Status: AC
  Administered 2020-09-28: 100 [IU] via INTRAMUSCULAR

## 2020-09-28 MED ORDER — LIDOCAINE HCL 2 % IJ SOLN
60.0000 mL | Freq: Once | INTRAMUSCULAR | Status: AC
  Administered 2020-09-28: 1200 mg

## 2020-09-28 NOTE — Progress Notes (Signed)
09/28/2020 2:33 PM   Anna Cameron Allegra Grana 04/16/1976 [id number]  Referring provider: Duanne Limerick, MD [address] [place],  [place] [address]  Chief Complaint  Patient presents with  . Botulinum Toxin Injection    HPI: I was consulted to assess the patient's urinary incontinence.  She works here in the hospital.  She has urge incontinence.  She leaks with coughing sneezing.  She would leak with bending lifting her bladder is full.  She had a bladder suspension years ago.  She had Botox in April by another clinician and was nearly 100% dry.  She has no bedwetting.  She wears 2 pads a day moderately wet  She has had a hysterectomy.  She is prone to constipation.  She tried 3 medications in the past which might be Detrol Ditropan and Myrbetriq    Patient has mixed incontinence.  She did very well with Botox.  We explained the role of urodynamics but we decided not to get them at this stage.  We can always change course.  She wants another Botox the last one lasted 7 months.  At some point in time she might go to another treatment but so far is very pleased with it. The other physician is out of network and she would prefer her care here for cost reasons and that we do them in the clinic  2-day Frequency stable.  Incontinence stable.  Clinically not infected and last culture negative  Cystoscopy and injection of Botox: Patient underwent flexible cystoscopy.  Bladder close and trigone were normal.  No cystitis.  I injected 100 units of Botox with 10 injections 1 cc/inj.  I was primarily in the midline and just to the left and right [place] along the lateral walls.  I was in the lower half of the bladder.  No bleeding.  Very well-tolerated.   PMH: Past Medical History:  Diagnosis Date  . Allergy     Surgical History: Past Surgical History:  Procedure Laterality Date  . ABDOMINAL HYSTERECTOMY    . bladder tact    . BOTOX INJECTION    . CESAREAN SECTION     x 3  .  CYSTOSCOPY WITH INJECTION N/A 09/05/2019   Procedure: CYSTOSCOPY WITH INJECTION BLADDER BOTOX;  Surgeon: Orson Ape, MD;  Location: [facility] OR[facility];  Service: Urology;  Laterality: N/A;    Home Medications:  Allergies as of 09/28/2020   No Known Allergies     Medication List       Accurate as of Sep 28, 2020  2:33 PM. If you have any questions, ask your nurse or doctor.        STOP taking these medications   ciprofloxacin 250 MG tablet Commonly known as: Cipro Stopped by: Martina Sinner, MD     TAKE these medications   fexofenadine 180 MG tablet Commonly known as: ALLEGRA Take 180 mg by mouth daily.   multivitamin with minerals Tabs tablet Take 1 tablet by mouth daily.   neomycin-polymyxin-hydrocortisone 3.5-10000-1 OTIC suspension Commonly known as: CORTISPORIN PLACE 4 DROPS INTO THE RIGHT EAR 4 TIMES DAILY FOR 7 DAYS. DISCARD REMAINING MEDICATION.   vitamin B-12 1000 MCG tablet Commonly known as: CYANOCOBALAMIN Take 1,000 mcg by mouth daily.   VITAMIN D3 COMPLETE PO Take 1 tablet by mouth daily before breakfast.       Allergies: No Known Allergies  Family History: Family History  Problem Relation Age of Onset  . Cancer Father   . Cancer Maternal  Grandmother     Social History:  reports that she has been smoking cigarettes. She has been smoking about 1.50 packs per day. She has never used smokeless tobacco. She reports previous alcohol use. She reports that she does not use drugs.  ROS:                                        Physical Exam: BP 122/78   Pulse 90   Constitutional:  Alert and oriented, No acute distress.   Laboratory Data: Lab Results  Component Value Date   WBC 7.4 02/19/2020   HGB 14.6 03/09/2020   HCT 42.1 03/09/2020   MCV 92.1 02/19/2020   PLT 255 02/19/2020    Lab Results  Component Value Date   CREATININE 0.70 01/24/2020    No results found for: PSA  No results found for: TESTOSTERONE  No  results found for: HGBA1C  Urinalysis    Component Value Date/Time   COLORURINE YELLOW 02/19/2020 1518   APPEARANCEUR Hazy (A) 09/14/2020 0957   LABSPEC <1.005 (L) 02/19/2020 1518   LABSPEC 1.003 09/19/2012 1805   PHURINE 5.0 02/19/2020 1518   GLUCOSEU Negative 09/14/2020 0957   GLUCOSEU Negative 09/19/2012 1805   HGBUR NEGATIVE 02/19/2020 1518   BILIRUBINUR Negative 09/14/2020 0957   BILIRUBINUR Negative 09/19/2012 1805   KETONESUR NEGATIVE 02/19/2020 1518   PROTEINUR Negative 09/14/2020 0957   PROTEINUR NEGATIVE 02/19/2020 1518   NITRITE Negative 09/14/2020 0957   NITRITE NEGATIVE 02/19/2020 1518   LEUKOCYTESUR Negative 09/14/2020 0957   LEUKOCYTESUR NEGATIVE 02/19/2020 1518   LEUKOCYTESUR Negative 09/19/2012 1805    Pertinent Imaging:   Assessment & Plan: The patient will be followed as per protocol There are no diagnoses linked to this encounter.  No follow-ups on file.  Martina Sinner, MD  [facility] [address] [place], [place] [address] [phone]

## 2020-09-28 NOTE — Progress Notes (Signed)
Bladder Instillation  Due to Botox injection patient is present today for a Bladder Instillation of 35ml 2% lidocaine. Patient was cleaned and prepped in a sterile fashion with betadine and lidocaine 2% jelly was instilled into the urethra.  A 14FR catheter was inserted, urine return was noted 80ml, urine was yellow in color.  50 ml was instilled into the bladder. The catheter was then removed. Patient tolerated well, no complications were noted Patient held in bladder for 30 minutes prior to procedure starting.   Performed by: Milas Kocher, CMA

## 2020-09-29 LAB — URINALYSIS, COMPLETE
Bilirubin, UA: NEGATIVE
Glucose, UA: NEGATIVE
Ketones, UA: NEGATIVE
Leukocytes,UA: NEGATIVE
Nitrite, UA: NEGATIVE
Protein,UA: NEGATIVE
Specific Gravity, UA: 1.02 (ref 1.005–1.030)
Urobilinogen, Ur: 0.2 mg/dL (ref 0.2–1.0)
pH, UA: 7 (ref 5.0–7.5)

## 2020-09-29 LAB — MICROSCOPIC EXAMINATION: Bacteria, UA: NONE SEEN

## 2020-10-12 ENCOUNTER — Ambulatory Visit: Payer: Self-pay | Admitting: Physician Assistant

## 2020-10-13 ENCOUNTER — Encounter: Payer: Self-pay | Admitting: Physician Assistant

## 2020-10-13 ENCOUNTER — Encounter: Payer: Self-pay | Admitting: Hematology and Oncology

## 2020-10-13 ENCOUNTER — Encounter: Payer: Self-pay | Admitting: Family Medicine

## 2020-10-13 ENCOUNTER — Other Ambulatory Visit: Payer: Self-pay

## 2020-10-13 ENCOUNTER — Ambulatory Visit (INDEPENDENT_AMBULATORY_CARE_PROVIDER_SITE_OTHER): Payer: No Typology Code available for payment source | Admitting: Family Medicine

## 2020-10-13 ENCOUNTER — Ambulatory Visit (INDEPENDENT_AMBULATORY_CARE_PROVIDER_SITE_OTHER): Payer: No Typology Code available for payment source | Admitting: Physician Assistant

## 2020-10-13 VITALS — BP 114/80 | HR 72 | Ht 66.5 in | Wt 192.0 lb

## 2020-10-13 VITALS — BP 123/76 | HR 76 | Ht 66.5 in | Wt 195.0 lb

## 2020-10-13 DIAGNOSIS — Z6829 Body mass index (BMI) 29.0-29.9, adult: Secondary | ICD-10-CM | POA: Diagnosis not present

## 2020-10-13 DIAGNOSIS — N3946 Mixed incontinence: Secondary | ICD-10-CM

## 2020-10-13 DIAGNOSIS — D582 Other hemoglobinopathies: Secondary | ICD-10-CM | POA: Diagnosis not present

## 2020-10-13 DIAGNOSIS — F17210 Nicotine dependence, cigarettes, uncomplicated: Secondary | ICD-10-CM

## 2020-10-13 LAB — BLADDER SCAN AMB NON-IMAGING: Scan Result: 26

## 2020-10-13 MED ORDER — NICOTINE 21 MG/24HR TD PT24: 21.0000 mg | MEDICATED_PATCH | Freq: Every day | TRANSDERMAL | 0 refills | Status: DC

## 2020-10-13 MED ORDER — VARENICLINE TARTRATE 1 MG PO TABS
ORAL_TABLET | ORAL | 0 refills | Status: DC
  Filled 2020-10-13: qty 56, 30d supply, fill #0
  Filled 2020-10-13: qty 53, 28d supply, fill #0

## 2020-10-13 NOTE — Patient Instructions (Addendum)

## 2020-10-13 NOTE — Progress Notes (Signed)
Date:  10/13/2020   Name:  Anna Cameron   DOB:  25-Jul-1975   MRN:  [id number]   Chief Complaint: Follow-up (Urinary incontinence- received botox in bladder by urology- doing better, but hasn't been that long)  Patient is a 45 year old female who presents for a erythrocytosis exam. The patient reports the following problems: smoking dependency. Health maintenance has been reviewed up to date.   Lab Results  Component Value Date   CREATININE 0.70 01/24/2020   BUN 14 01/24/2020   NA 143 01/24/2020   K 3.6 01/24/2020   CL 102 01/24/2020   CO2 25 01/24/2020   No results found for: CHOL, HDL, LDLCALC, LDLDIRECT, TRIG, CHOLHDL Lab Results  Component Value Date   TSH 2.210 01/24/2020   No results found for: HGBA1C Lab Results  Component Value Date   WBC 7.4 02/19/2020   HGB 14.6 03/09/2020   HCT 42.1 03/09/2020   MCV 92.1 02/19/2020   PLT 255 02/19/2020   Lab Results  Component Value Date   ALT 20 01/24/2020   AST 21 01/24/2020   ALKPHOS 71 01/24/2020   BILITOT <0.2 01/24/2020     Review of Systems  Constitutional: Negative.  Negative for chills, fatigue, fever and unexpected weight change.  HENT: Negative for congestion, ear discharge, ear pain, rhinorrhea, sinus pressure, sneezing and sore throat.   Eyes: Negative for photophobia, pain, discharge, redness and itching.  Respiratory: Negative for cough, shortness of breath, wheezing and stridor.   Gastrointestinal: Negative for abdominal pain, blood in stool, constipation, diarrhea, nausea and vomiting.  Endocrine: Negative for cold intolerance, heat intolerance, polydipsia, polyphagia and polyuria.  Genitourinary: Negative for dysuria, flank pain, frequency, hematuria, menstrual problem, pelvic pain, urgency, vaginal bleeding and vaginal discharge.  Musculoskeletal: Negative for arthralgias, back pain and myalgias.  Skin: Negative for rash.  Allergic/Immunologic: Negative for environmental allergies and food  allergies.  Neurological: Negative for dizziness, weakness, light-headedness, numbness and headaches.  Hematological: Negative for adenopathy. Does not bruise/bleed easily.  Psychiatric/Behavioral: Negative for dysphoric mood. The patient is not nervous/anxious.     Patient Active Problem List   Diagnosis Date Noted  . H/O total hysterectomy 07/14/2020  . [name] 02/19/2020    No Known Allergies  Past Surgical History:  Procedure Laterality Date  . ABDOMINAL HYSTERECTOMY    . bladder tact    . BOTOX INJECTION    . CESAREAN SECTION     x 3  . CYSTOSCOPY WITH INJECTION N/A 09/05/2019   Procedure: CYSTOSCOPY WITH INJECTION BLADDER BOTOX;  Surgeon: Orson Ape, MD;  Location: [facility];  Service: Urology;  Laterality: N/A;    Social History   Tobacco Use  . Smoking status: Current Every Day Smoker    Packs/day: 1.50    Types: Cigarettes  . Smokeless tobacco: Never Used  Substance Use Topics  . Alcohol use: Not Currently  . Drug use: Never     Medication list has been reviewed and updated.  Current Meds  Medication Sig  . fexofenadine (ALLEGRA) 180 MG tablet Take 180 mg by mouth daily.  . Multiple Vitamin (MULTIVITAMIN WITH MINERALS) TABS tablet Take 1 tablet by mouth daily.  . Multiple Vitamins-Minerals (VITAMIN D3 COMPLETE PO) Take 1 tablet by mouth daily before breakfast.  . vitamin B-12 (CYANOCOBALAMIN) 1000 MCG tablet Take 1,000 mcg by mouth daily.   . [DISCONTINUED] neomycin-polymyxin-hydrocortisone (CORTISPORIN) 3.5-10000-1 OTIC suspension PLACE 4 DROPS INTO THE RIGHT EAR 4 TIMES DAILY FOR 7 DAYS. DISCARD REMAINING  MEDICATION.    PHQ 2/9 Scores 07/14/2020 01/24/2020 12/03/2019  PHQ - 2 Score 4 2 4   PHQ- 9 Score 11 10 9     GAD 7 : Generalized Anxiety Score 07/14/2020 01/24/2020 12/03/2019  Nervous, Anxious, on Edge 0 0 0  Control/stop worrying 0 0 0  Worry too much - different things 0 0 2  Trouble relaxing 0 0 2  Restless 0 0 0  Easily annoyed or  irritable 1 3 1   Afraid - awful might happen 0 0 0  Total GAD 7 Score 1 3 5   Anxiety Difficulty Not difficult at all Not difficult at all Not difficult at all    BP Readings from Last 3 Encounters:  10/13/20 114/80  10/13/20 123/76  09/28/20 122/78    Physical Exam Constitutional:      Appearance: She is well-developed.  HENT:     Head: Normocephalic.     Right Ear: Tympanic membrane, ear canal and external ear normal. There is no impacted cerumen.     Left Ear: Tympanic membrane, ear canal and external ear normal. There is no impacted cerumen.     Nose: Nose normal. No congestion.     Mouth/Throat:     Mouth: Mucous membranes are moist.  Eyes:     General: Lids are everted, no foreign bodies appreciated. No scleral icterus.       Left eye: No foreign body or hordeolum.     Conjunctiva/sclera: Conjunctivae normal.     Right eye: Right conjunctiva is not injected.     Left eye: Left conjunctiva is not injected.     Pupils: Pupils are equal, round, and reactive to light.  Neck:     Thyroid: No thyromegaly.     Vascular: No JVD.     Trachea: No tracheal deviation.  Cardiovascular:     Rate and Rhythm: Normal rate and regular rhythm.     Heart sounds: Normal heart sounds. No murmur heard. No friction rub. No gallop.   Pulmonary:     Effort: Pulmonary effort is normal. No respiratory distress.     Breath sounds: Normal breath sounds. No wheezing, rhonchi or rales.  Abdominal:     General: Bowel sounds are normal.     Palpations: Abdomen is soft. There is no mass.     Tenderness: There is no abdominal tenderness. There is no guarding or rebound.  Musculoskeletal:        General: No tenderness. Normal range of motion.     Cervical back: Normal range of motion and neck supple.  Lymphadenopathy:     Cervical: No cervical adenopathy.  Skin:    General: Skin is warm.     Findings: No rash.  Neurological:     Mental Status: She is alert and oriented to person, place, and  time.     Cranial Nerves: No cranial nerve deficit.     Deep Tendon Reflexes: Reflexes normal.  Psychiatric:        Mood and Affect: Mood is not anxious or depressed.     Wt Readings from Last 3 Encounters:  10/13/20 192 lb (87.1 kg)  10/13/20 195 lb (88.5 kg)  08/24/20 190 lb (86.2 kg)    BP 114/80   Pulse 72   Ht 5' 6.5" (1.689 m)   Wt 192 lb (87.1 kg)   BMI 30.53 kg/m   Assessment and Plan:  1. Elevated hemoglobin (HCC) Chronic.  Uncontrolled.  Complicated.  Patient has a history of erythrocytosis for  which she has seen Dr. Merlene Pulling in the past.  She was to undergo phlebotomies every 2 weeks but did not sustain this course of action.  Upon questioning patient is donating plasma once or twice a week but understands that this is the precise reason why which she may be having elevated hemoglobin.  Patient is to stop phlebotomies for platelet donations.  We will recheck a CBC in the meantime to see where the level of hemoglobin he has.  And we will recheck patient in 6 weeks hoping that were not going to need decreasing hematology. - CBC with Differential/Platelet  2. Cigarette nicotine dependence without complication Patient has been advised of the health risks of smoking and counseled concerning cessation of tobacco products. I spent over 3 minutes for discussion and to answer questions.  Patient has been successful with Chantix in the past but I understand that this may not be available at her drugstore at this time.  So she is also been given a written prescription for NicoDerm CQ patches 21 mg and will start this and attempt to discontinue smoking which may be contributing to her increased hemoglobin concern.  - varenicline (CHANTIX STARTING MONTH PAK) 0.5 MG X 11 & 1 MG X 42 tablet; Take one 0.5 mg tablet by mouth once daily for 3 days, then increase to one 0.5 mg tablet twice daily for 4 days, then increase to one 1 mg tablet twice daily.  Dispense: 53 tablet; Refill: 0  3. BMI  29.0-29.9,adult Health risks of being over weight were discussed and patient was counseled on weight loss options and exercise.  Patient has been given a Mediterranean diet for weight loss.

## 2020-10-13 NOTE — Progress Notes (Signed)
10/13/2020 3:00 PM   Anna Cameron February 04, 1976 [id number]  CC: Chief Complaint  Patient presents with  . Urinary Incontinence    HPI: Anna Cameron is a 45 y.o. female with mixed incontinence previously treated with intravesical Botox who underwent repeat intravesical Botox with Dr. Sherron Monday 15 days ago who presents today for symptom recheck and PVR.  Today she reports decreased urinary frequency, decreased but persistent to urinary leakage, and increased volume of voids.  Overall, she thinks she had a better response to her first intravesical Botox treatment, but she has noticed a difference after this most recent treatment.    In-office UA and microscopy today pan negative.  PVR 26 mL.   PMH: Past Medical History:  Diagnosis Date  . Allergy     Surgical History: Past Surgical History:  Procedure Laterality Date  . ABDOMINAL HYSTERECTOMY    . bladder tact    . BOTOX INJECTION    . CESAREAN SECTION     x 3  . CYSTOSCOPY WITH INJECTION N/A 09/05/2019   Procedure: CYSTOSCOPY WITH INJECTION BLADDER BOTOX;  Surgeon: Orson Ape, MD;  Location: [facility] OR[facility];  Service: Urology;  Laterality: N/A;    Home Medications:  Allergies as of 10/13/2020   No Known Allergies     Medication List       Accurate as of Oct 13, 2020  3:00 PM. If you have any questions, ask your nurse or doctor.        fexofenadine 180 MG tablet Commonly known as: ALLEGRA Take 180 mg by mouth daily.   multivitamin with minerals Tabs tablet Take 1 tablet by mouth daily.   neomycin-polymyxin-hydrocortisone 3.5-10000-1 OTIC suspension Commonly known as: CORTISPORIN PLACE 4 DROPS INTO THE RIGHT EAR 4 TIMES DAILY FOR 7 DAYS. DISCARD REMAINING MEDICATION.   vitamin B-12 1000 MCG tablet Commonly known as: CYANOCOBALAMIN Take 1,000 mcg by mouth daily.   VITAMIN D3 COMPLETE PO Take 1 tablet by mouth daily before breakfast.       Allergies:  No Known Allergies  Family History: Family  History  Problem Relation Age of Onset  . Cancer Father   . Cancer Maternal Grandmother     Social History:   reports that she has been smoking cigarettes. She has been smoking about 1.50 packs per day. She has never used smokeless tobacco. She reports previous alcohol use. She reports that she does not use drugs.  Physical Exam: BP 123/76   Pulse 76   Ht 5' 6.5" (1.689 m)   Wt 195 lb (88.5 kg)   BMI 31.00 kg/m   Constitutional:  Alert and oriented, no acute distress, nontoxic appearing HEENT: [place], AT Cardiovascular: No clubbing, cyanosis, or edema Respiratory: Normal respiratory effort, no increased work of breathing Skin: No rashes, bruises or suspicious lesions Neurologic: Grossly intact, no focal deficits, moving all 4 extremities Psychiatric: Normal mood and affect  Laboratory Data: Results for orders placed or performed in visit on 10/13/20  Microscopic Examination   Urine  Result Value Ref Range   WBC, UA 0-5 0 - 5 /hpf   RBC None seen 0 - 2 /hpf   Epithelial Cells (non renal) 0-10 0 - 10 /hpf   Bacteria, UA Few None seen/Few  Urinalysis, Complete  Result Value Ref Range   Specific Gravity, UA 1.010 1.005 - 1.030   pH, UA 5.0 5.0 - 7.5   Color, UA Yellow Yellow   Appearance Ur Clear Clear   Leukocytes,UA Negative Negative  Protein,UA Negative Negative/Trace   Glucose, UA Negative Negative   Ketones, UA Negative Negative   RBC, UA Negative Negative   Bilirubin, UA Negative Negative   Urobilinogen, Ur 0.2 0.2 - 1.0 mg/dL   Nitrite, UA Negative Negative   Microscopic Examination See below:   Bladder Scan (Post Void Residual) in office  Result Value Ref Range   Scan Result 26    Assessment & Plan:   1. Mixed incontinence Symptoms improved following recent intravesical Botox, with PVR WNL and UA benign today.  Patient notes greater therapeutic response after her last intravesical Botox treatment.  I counseled her to contact the office in 1 to 2 weeks if she  continues to have suboptimal therapeutic response, at which point we may consider retreatment with Dr. Sherron Monday.  She expressed understanding. - Urinalysis, Complete - Bladder Scan (Post Void Residual) in office  Return in about 5 months (around 03/15/2021) for Post-Botox sx recheck and scheduling.  Carman Ching, PA-C  [facility] [address], [address] [place], [place] [address] [phone]

## 2020-10-14 LAB — CBC WITH DIFFERENTIAL/PLATELET
Basophils Absolute: 0.1 10*3/uL (ref 0.0–0.2)
Basos: 1 %
EOS (ABSOLUTE): 0.3 10*3/uL (ref 0.0–0.4)
Eos: 3 %
Hematocrit: 48.1 % — ABNORMAL HIGH (ref 34.0–46.6)
Hemoglobin: 16.4 g/dL — ABNORMAL HIGH (ref 11.1–15.9)
Immature Grans (Abs): 0 10*3/uL (ref 0.0–0.1)
Immature Granulocytes: 0 %
Lymphocytes Absolute: 2.7 10*3/uL (ref 0.7–3.1)
Lymphs: 27 %
MCH: 32 pg (ref 26.6–33.0)
MCHC: 34.1 g/dL (ref 31.5–35.7)
MCV: 94 fL (ref 79–97)
Monocytes Absolute: 0.7 10*3/uL (ref 0.1–0.9)
Monocytes: 7 %
Neutrophils Absolute: 6.1 10*3/uL (ref 1.4–7.0)
Neutrophils: 62 %
Platelets: 361 10*3/uL (ref 150–450)
RBC: 5.13 x10E6/uL (ref 3.77–5.28)
RDW: 12.2 % (ref 11.7–15.4)
WBC: 10 10*3/uL (ref 3.4–10.8)

## 2020-10-15 LAB — URINALYSIS, COMPLETE
Bilirubin, UA: NEGATIVE
Glucose, UA: NEGATIVE
Ketones, UA: NEGATIVE
Leukocytes,UA: NEGATIVE
Nitrite, UA: NEGATIVE
Protein,UA: NEGATIVE
RBC, UA: NEGATIVE
Specific Gravity, UA: 1.01 (ref 1.005–1.030)
Urobilinogen, Ur: 0.2 mg/dL (ref 0.2–1.0)
pH, UA: 5 (ref 5.0–7.5)

## 2020-10-15 LAB — MICROSCOPIC EXAMINATION: RBC, Urine: NONE SEEN /hpf (ref 0–2)

## 2020-10-16 ENCOUNTER — Other Ambulatory Visit: Payer: Self-pay

## 2020-10-30 ENCOUNTER — Other Ambulatory Visit: Payer: Self-pay

## 2020-10-30 ENCOUNTER — Ambulatory Visit (INDEPENDENT_AMBULATORY_CARE_PROVIDER_SITE_OTHER): Payer: No Typology Code available for payment source | Admitting: Physician Assistant

## 2020-10-30 VITALS — BP 118/76 | HR 91 | Ht 66.5 in | Wt 192.0 lb

## 2020-10-30 DIAGNOSIS — N3946 Mixed incontinence: Secondary | ICD-10-CM | POA: Diagnosis not present

## 2020-10-30 LAB — BLADDER SCAN AMB NON-IMAGING

## 2020-10-30 LAB — MICROSCOPIC EXAMINATION: Bacteria, UA: NONE SEEN

## 2020-10-30 LAB — URINALYSIS, COMPLETE
Bilirubin, UA: NEGATIVE
Glucose, UA: NEGATIVE
Ketones, UA: NEGATIVE
Leukocytes,UA: NEGATIVE
Nitrite, UA: NEGATIVE
Protein,UA: NEGATIVE
RBC, UA: NEGATIVE
Specific Gravity, UA: 1.02 (ref 1.005–1.030)
Urobilinogen, Ur: 0.2 mg/dL (ref 0.2–1.0)
pH, UA: 5 (ref 5.0–7.5)

## 2020-10-30 NOTE — Progress Notes (Signed)
10/30/2020 1:21 PM   Anna Cameron [birth year] [id number]  CC: Chief Complaint  Patient presents with   Urinary Incontinence   HPI: Anna Cameron is a 45 y.o. female with PMH mixed incontinence managed with intravesical Botox, most recently on 09/28/2020 who presents today for evaluation of ongoing urinary frequency and leakage.  I saw her in clinic most recently on 10/13/2020 for post Botox follow-up, at which point her PVR was WNL but she reported lesser therapeutic improvement after her most recent Botox then following earlier treatments.  Today she reports continued bothersome frequency at least every hour and urge incontinence at least 3 times daily.  She denies stress incontinence.  In-office UA and microscopy today pan negative. PVR 37mL.  PMH: Past Medical History:  Diagnosis Date   Allergy     Surgical History: Past Surgical History:  Procedure Laterality Date   ABDOMINAL HYSTERECTOMY     bladder tact     BOTOX INJECTION     CESAREAN SECTION     x 3   CYSTOSCOPY WITH INJECTION N/A 09/05/2019   Procedure: CYSTOSCOPY WITH INJECTION BLADDER BOTOX;  Surgeon: Orson Ape, MD;  Location: [facility] OR[facility];  Service: Urology;  Laterality: N/A;    Home Medications:  Allergies as of 10/30/2020   No Known Allergies      Medication List        Accurate as of October 30, 2020  1:21 PM. If you have any questions, ask your nurse or doctor.          fexofenadine 180 MG tablet Commonly known as: ALLEGRA Take 180 mg by mouth daily.   multivitamin with minerals Tabs tablet Take 1 tablet by mouth daily.   nicotine 21 mg/24hr patch Commonly known as: NICODERM CQ - dosed in mg/24 hours Place 1 patch (21 mg total) onto the skin daily.   varenicline 1 MG tablet Commonly known as: CHANTIX Take 1/2 tablet by mouth once daily for 3 days, then increase to 1/2 tablet twice daily for 4 days, then increase to one tablet twice daily. (Take 1/2 tablet by mouth once daily for 3  days, then increase to 1/2 tablet twice daily for 4 days, then increase to one tablet twice daily.)   vitamin B-12 1000 MCG tablet Commonly known as: CYANOCOBALAMIN Take 1,000 mcg by mouth daily.   VITAMIN D3 COMPLETE PO Take 1 tablet by mouth daily before breakfast.        Allergies:  No Known Allergies  Family History: Family History  Problem Relation Age of Onset   Cancer Father    Cancer Maternal Grandmother     Social History:   reports that she has been smoking cigarettes. She has been smoking an average of 1.50 packs per day. She has never used smokeless tobacco. She reports previous alcohol use. She reports that she does not use drugs.  Physical Exam: BP 118/76   Pulse 91   Ht 5' 6.5" (1.689 m)   Wt 192 lb (87.1 kg)   BMI 30.53 kg/m   Constitutional:  Alert and oriented, no acute distress, nontoxic appearing HEENT: [place], AT Cardiovascular: No clubbing, cyanosis, or edema Respiratory: Normal respiratory effort, no increased work of breathing Skin: No rashes, bruises or suspicious lesions Neurologic: Grossly intact, no focal deficits, moving all 4 extremities Psychiatric: Normal mood and affect  Laboratory Data: Results for orders placed or performed in visit on 10/30/20  Microscopic Examination   Urine  Result Value Ref Range  WBC, UA 0-5 0 - 5 /hpf   RBC 0-2 0 - 2 /hpf   Epithelial Cells (non renal) 0-10 0 - 10 /hpf   Bacteria, UA None seen None seen/Few  Urinalysis, Complete  Result Value Ref Range   Specific Gravity, UA 1.020 1.005 - 1.030   pH, UA 5.0 5.0 - 7.5   Color, UA Yellow Yellow   Appearance Ur Clear Clear   Leukocytes,UA Negative Negative   Protein,UA Negative Negative/Trace   Glucose, UA Negative Negative   Ketones, UA Negative Negative   RBC, UA Negative Negative   Bilirubin, UA Negative Negative   Urobilinogen, Ur 0.2 0.2 - 1.0 mg/dL   Nitrite, UA Negative Negative   Microscopic Examination See below:   BLADDER SCAN AMB  NON-IMAGING  Result Value Ref Range   Scan Result 10mL    Assessment & Plan:   1. Mixed incontinence Inadequate therapeutic response following her most recent administration of intravesical Botox.  She is emptying appropriately and UA remains benign.  I think she would benefit from retreatment versus dose increase in the future.  We will have her follow-up with Dr. Sherron Monday to discuss her options.  Notably, her PA only covers 100 units every 6 months.  - Urinalysis, Complete - BLADDER SCAN AMB NON-IMAGING  Return in about 4 weeks (around 11/27/2020) for Repeat Botox with Dr. Sherron Monday.  Carman Ching, PA-C  [facility] [address] [place], [place] [address] [phone]

## 2020-11-02 ENCOUNTER — Ambulatory Visit (INDEPENDENT_AMBULATORY_CARE_PROVIDER_SITE_OTHER): Payer: No Typology Code available for payment source | Admitting: Urology

## 2020-11-02 ENCOUNTER — Telehealth: Payer: Self-pay

## 2020-11-02 ENCOUNTER — Other Ambulatory Visit: Payer: Self-pay

## 2020-11-02 VITALS — BP 125/81 | HR 89 | Wt 192.0 lb

## 2020-11-02 DIAGNOSIS — N3946 Mixed incontinence: Secondary | ICD-10-CM | POA: Diagnosis not present

## 2020-11-02 LAB — URINALYSIS, COMPLETE
Bilirubin, UA: NEGATIVE
Glucose, UA: NEGATIVE
Ketones, UA: NEGATIVE
Leukocytes,UA: NEGATIVE
Nitrite, UA: NEGATIVE
Protein,UA: NEGATIVE
RBC, UA: NEGATIVE
Specific Gravity, UA: 1.015 (ref 1.005–1.030)
Urobilinogen, Ur: 0.2 mg/dL (ref 0.2–1.0)
pH, UA: 5 (ref 5.0–7.5)

## 2020-11-02 LAB — MICROSCOPIC EXAMINATION: RBC, Urine: NONE SEEN /hpf (ref 0–2)

## 2020-11-02 MED ORDER — CIPROFLOXACIN HCL 250 MG PO TABS
ORAL_TABLET | ORAL | 0 refills | Status: DC
  Filled 2020-11-02: qty 6, 3d supply, fill #0

## 2020-11-02 NOTE — Progress Notes (Signed)
11/02/2020 10:15 AM   Anna Cameron [birth year] [id number]  Referring provider: Duanne Limerick, MD [address] [place],  [place] [address]  Chief Complaint  Patient presents with   Urinary Incontinence    HPI: I was consulted to assess the patient's urinary incontinence.  She works here in the hospital.  She has urge incontinence.  She leaks with coughing sneezing.  She would leak with bending lifting her bladder is full.  She had a bladder suspension years ago.  She had Botox in April by another clinician and was nearly 100% dry.  She has no bedwetting.  She wears 2 pads a day moderately wet   She has had a hysterectomy.  She is prone to constipation.  She tried 3 medications in the past which might be Detrol Ditropan and Myrbetriq     Patient has mixed incontinence.  She did very well with Botox.  We explained the role of urodynamics but we decided not to get them at this stage.  We can always change course.  She wants another Botox the last one lasted 7 months.  At some point in time she might go to another treatment but so far is very pleased with it. The other physician is out of network and she would prefer her care here for cost reasons and that we do them in the clinic   2-day Frequency stable.  Incontinence stable.  Clinically not infected and last culture negative   Cystoscopy and injection of Botox: Patient underwent flexible cystoscopy.  Bladder close and trigone were normal.  No cystitis.  I injected 100 units of Botox with 10 injections 1 cc/inj.  I was primarily in the midline and just to the left and right [place] along the lateral walls.  I was in the lower half of the bladder.  No bleeding.  Very well-tolerated.  Today On May 9 patient had Botox.  She saw a nurse practitioner with low residual and still has mixed incontinence Currently wearing 2 pads a day that are damp with a 40 to 50% improvement.  No bedwetting.  Still leaks a little bit with coughing  sneezing but does reflux maneuvers to prevent.  Clinically not infected       PMH: Past Medical History:  Diagnosis Date   Allergy     Surgical History: Past Surgical History:  Procedure Laterality Date   ABDOMINAL HYSTERECTOMY     bladder tact     BOTOX INJECTION     CESAREAN SECTION     x 3   CYSTOSCOPY WITH INJECTION N/A 09/05/2019   Procedure: CYSTOSCOPY WITH INJECTION BLADDER BOTOX;  Surgeon: Orson Ape, MD;  Location: [facility] ORS;  Service: Urology;  Laterality: N/A;    Home Medications:  Allergies as of 11/02/2020   No Known Allergies      Medication List        Accurate as of November 02, 2020 10:15 AM. If you have any questions, ask your nurse or doctor.          fexofenadine 180 MG tablet Commonly known as: ALLEGRA Take 180 mg by mouth daily.   multivitamin with minerals Tabs tablet Take 1 tablet by mouth daily.   nicotine 21 mg/24hr patch Commonly known as: NICODERM CQ - dosed in mg/24 hours Place 1 patch (21 mg total) onto the skin daily.   varenicline 1 MG tablet Commonly known as: CHANTIX Take 1/2 tablet by mouth once daily for 3 days, then  increase to 1/2 tablet twice daily for 4 days, then increase to one tablet twice daily. (Take 1/2 tablet by mouth once daily for 3 days, then increase to 1/2 tablet twice daily for 4 days, then increase to one tablet twice daily.)   vitamin B-12 1000 MCG tablet Commonly known as: CYANOCOBALAMIN Take 1,000 mcg by mouth daily.   VITAMIN D3 COMPLETE PO Take 1 tablet by mouth daily before breakfast.        Allergies: No Known Allergies  Family History: Family History  Problem Relation Age of Onset   Cancer Father    Cancer Maternal Grandmother     Social History:  reports that she has been smoking cigarettes. She has been smoking an average of 1.50 packs per day. She has never used smokeless tobacco. She reports previous alcohol use. She reports that she does not use drugs.  ROS:                                         Physical Exam: There were no vitals taken for this visit.  Constitutional:  Alert and oriented, No acute distress.   Laboratory Data: Lab Results  Component Value Date   WBC 10.0 10/13/2020   HGB 16.4 (H) 10/13/2020   HCT 48.1 (H) 10/13/2020   MCV 94 10/13/2020   PLT 361 10/13/2020    Lab Results  Component Value Date   CREATININE 0.70 01/24/2020    No results found for: PSA  No results found for: TESTOSTERONE  No results found for: HGBA1C  Urinalysis    Component Value Date/Time   COLORURINE YELLOW 02/19/2020 1518   APPEARANCEUR Clear 10/30/2020 1253   LABSPEC <1.005 (L) 02/19/2020 1518   LABSPEC 1.003 09/19/2012 1805   PHURINE 5.0 02/19/2020 1518   GLUCOSEU Negative 10/30/2020 1253   GLUCOSEU Negative 09/19/2012 1805   HGBUR NEGATIVE 02/19/2020 1518   BILIRUBINUR Negative 10/30/2020 1253   BILIRUBINUR Negative 09/19/2012 1805   KETONESUR NEGATIVE 02/19/2020 1518   PROTEINUR Negative 10/30/2020 1253   PROTEINUR NEGATIVE 02/19/2020 1518   NITRITE Negative 10/30/2020 1253   NITRITE NEGATIVE 02/19/2020 1518   LEUKOCYTESUR Negative 10/30/2020 1253   LEUKOCYTESUR NEGATIVE 02/19/2020 1518   LEUKOCYTESUR Negative 09/19/2012 1805    Pertinent Imaging:   Assessment & Plan: Patient expectations and reproducibility of response discussed.  For insurance reasons we will repeat 100 units of Botox 6 months after the first treatment.  This will be arranged as per protocol.  Call if urine culture positive.  There are no diagnoses linked to this encounter.  No follow-ups on file.  Martina Sinner, MD  [facility] [address], [address] [place], [place] [address] [phone]

## 2020-11-02 NOTE — Telephone Encounter (Signed)
Sent in antibiotics for pt prior to procedure in november

## 2020-11-06 LAB — CULTURE, URINE COMPREHENSIVE

## 2020-11-17 ENCOUNTER — Other Ambulatory Visit: Payer: Self-pay

## 2020-11-24 ENCOUNTER — Ambulatory Visit: Payer: No Typology Code available for payment source | Admitting: Family Medicine

## 2020-12-01 ENCOUNTER — Other Ambulatory Visit: Payer: Self-pay

## 2020-12-01 ENCOUNTER — Ambulatory Visit (INDEPENDENT_AMBULATORY_CARE_PROVIDER_SITE_OTHER): Payer: No Typology Code available for payment source | Admitting: Family Medicine

## 2020-12-01 ENCOUNTER — Encounter: Payer: Self-pay | Admitting: Family Medicine

## 2020-12-01 VITALS — BP 138/70 | HR 72 | Ht 66.5 in | Wt 195.0 lb

## 2020-12-01 DIAGNOSIS — F17210 Nicotine dependence, cigarettes, uncomplicated: Secondary | ICD-10-CM

## 2020-12-01 DIAGNOSIS — D582 Other hemoglobinopathies: Secondary | ICD-10-CM

## 2020-12-01 NOTE — Progress Notes (Signed)
Date:  12/01/2020   Name:  Anna Cameron   DOB:  1975-11-29   MRN:  [id number]   Chief Complaint: Follow-up (Elevated hemoglobin- hasn't been donating since last appt)  Patient is a 45 year old female who presents for a erythrocytosis evaluationnone. The patient reports the following problems: elevated hgb. Health maintenance has been reviewed up to date.     Lab Results  Component Value Date   CREATININE 0.70 01/24/2020   BUN 14 01/24/2020   NA 143 01/24/2020   K 3.6 01/24/2020   CL 102 01/24/2020   CO2 25 01/24/2020   No results found for: CHOL, HDL, LDLCALC, LDLDIRECT, TRIG, CHOLHDL Lab Results  Component Value Date   TSH 2.210 01/24/2020   No results found for: HGBA1C Lab Results  Component Value Date   WBC 10.0 10/13/2020   HGB 16.4 (H) 10/13/2020   HCT 48.1 (H) 10/13/2020   MCV 94 10/13/2020   PLT 361 10/13/2020   Lab Results  Component Value Date   ALT 20 01/24/2020   AST 21 01/24/2020   ALKPHOS 71 01/24/2020   BILITOT <0.2 01/24/2020     Review of Systems  Patient Active Problem List   Diagnosis Date Noted   H/O total hysterectomy 07/14/2020   [name] 02/19/2020    No Known Allergies  Past Surgical History:  Procedure Laterality Date   ABDOMINAL HYSTERECTOMY     bladder tact     BOTOX INJECTION     CESAREAN SECTION     x 3   CYSTOSCOPY WITH INJECTION N/A 09/05/2019   Procedure: CYSTOSCOPY WITH INJECTION BLADDER BOTOX;  Surgeon: Orson Ape, MD;  Location: [facility] ORS;  Service: Urology;  Laterality: N/A;    Social History   Tobacco Use   Smoking status: Every Day    Packs/day: 1.50    Pack years: 0.00    Types: Cigarettes   Smokeless tobacco: Never  Substance Use Topics   Alcohol use: Not Currently   Drug use: Never     Medication list has been reviewed and updated.  Current Meds  Medication Sig   fexofenadine (ALLEGRA) 180 MG tablet Take 180 mg by mouth daily.   Multiple Vitamin (MULTIVITAMIN WITH MINERALS) TABS  tablet Take 1 tablet by mouth daily.   Multiple Vitamins-Minerals (VITAMIN D3 COMPLETE PO) Take 1 tablet by mouth daily before breakfast.   vitamin B-12 (CYANOCOBALAMIN) 1000 MCG tablet Take 1,000 mcg by mouth daily.     PHQ 2/9 Scores 07/14/2020 01/24/2020 12/03/2019  PHQ - 2 Score 4 2 4   PHQ- 9 Score 11 10 9     GAD 7 : Generalized Anxiety Score 07/14/2020 01/24/2020 12/03/2019  Nervous, Anxious, on Edge 0 0 0  Control/stop worrying 0 0 0  Worry too much - different things 0 0 2  Trouble relaxing 0 0 2  Restless 0 0 0  Easily annoyed or irritable 1 3 1   Afraid - awful might happen 0 0 0  Total GAD 7 Score 1 3 5   Anxiety Difficulty Not difficult at all Not difficult at all Not difficult at all    BP Readings from Last 3 Encounters:  12/01/20 138/70  11/02/20 125/81  10/30/20 118/76    Physical Exam  Wt Readings from Last 3 Encounters:  12/01/20 195 lb (88.5 kg)  11/02/20 192 lb (87.1 kg)  10/30/20 192 lb (87.1 kg)    BP 138/70   Pulse 72   Ht 5' 6.5" (1.689 m)  Wt 195 lb (88.5 kg)   BMI 31.00 kg/m   Assessment and Plan: 1. Elevated hemoglobin (HCC) Patient originally followed by hematology/Dr. Merlene Pulling for erythrocytosis.  Reviewed that there is no elevation of ferritin or iron nor was there other CBC abnormalities.  Patient has been encouraged to be careful with her platelets and in the meantime we will check hematocrit/hemoglobin for current level of control. - Hemoglobin and hematocrit, blood  2. Cigarette nicotine dependence without complication Patient has been advised of the health risks of smoking and counseled concerning cessation of tobacco products. I spent over 3 minutes for discussion and to answer questions.  We have discussed that our goal by the end of the year would be to be at 1 pack a day from her current 1-1/2 packs and that is the pill that we made and I will be very pleased if we make this goal for this patient

## 2020-12-02 ENCOUNTER — Other Ambulatory Visit: Payer: Self-pay

## 2020-12-02 DIAGNOSIS — F17210 Nicotine dependence, cigarettes, uncomplicated: Secondary | ICD-10-CM

## 2020-12-02 LAB — HEMOGLOBIN AND HEMATOCRIT, BLOOD
Hematocrit: 45.6 % (ref 34.0–46.6)
Hemoglobin: 15.6 g/dL (ref 11.1–15.9)

## 2020-12-02 MED ORDER — VARENICLINE TARTRATE 1 MG PO TABS
ORAL_TABLET | ORAL | 0 refills | Status: DC
  Filled 2020-12-02 – 2021-01-27 (×2): qty 56, 28d supply, fill #0

## 2020-12-07 ENCOUNTER — Other Ambulatory Visit: Payer: Self-pay

## 2020-12-07 ENCOUNTER — Ambulatory Visit (INDEPENDENT_AMBULATORY_CARE_PROVIDER_SITE_OTHER): Payer: No Typology Code available for payment source | Admitting: Obstetrics and Gynecology

## 2020-12-07 ENCOUNTER — Encounter: Payer: Self-pay | Admitting: Obstetrics and Gynecology

## 2020-12-07 VITALS — BP 116/79 | HR 90 | Ht 66.5 in | Wt 190.3 lb

## 2020-12-07 DIAGNOSIS — N8111 Cystocele, midline: Secondary | ICD-10-CM | POA: Diagnosis not present

## 2020-12-07 DIAGNOSIS — N951 Menopausal and female climacteric states: Secondary | ICD-10-CM | POA: Diagnosis not present

## 2020-12-07 DIAGNOSIS — L918 Other hypertrophic disorders of the skin: Secondary | ICD-10-CM | POA: Diagnosis not present

## 2020-12-07 DIAGNOSIS — Z01419 Encounter for gynecological examination (general) (routine) without abnormal findings: Secondary | ICD-10-CM | POA: Diagnosis not present

## 2020-12-07 NOTE — Progress Notes (Signed)
HPI:      [name] is a 45 y.o. No obstetric history on file. who LMP was No LMP recorded. Patient has had a hysterectomy.  Subjective:   She presents today for her annual examination.   She also states that she has daily hot flashes which have been somewhat disruptive. She complains of a vulvar/labial growth that has been getting worse over the last year.  She is requesting removal if possible. Of significant note patient has had a previous hysterectomy for heavy menstrual bleeding. Patient has had prior sling surgery for urinary incontinence and has been diagnosed with "mixed incontinence" by urology.  Previously had Botox which worked for her. Tobacco abuse    Hx: The following portions of the patient's history were reviewed and updated as appropriate:             She  has a past medical history of Allergy. She does not have any pertinent problems on file. She  has a past surgical history that includes Abdominal hysterectomy; Cystoscopy with injection (N/A, 09/05/2019); Cesarean section; Botox injection; and bladder tact. Her family history includes Cancer in her father and maternal grandmother. She  reports that she has been smoking cigarettes. She has been smoking an average of 1.5 packs per day. She has never used smokeless tobacco. She reports previous alcohol use. She reports that she does not use drugs. She has a current medication list which includes the following prescription(s): fexofenadine, multivitamin with minerals, multiple vitamins-minerals, varenicline, and vitamin b-12. She has No Known Allergies.       Review of Systems:  Review of Systems  Constitutional: Denied constitutional symptoms, night sweats, recent illness, fatigue, fever, insomnia and weight loss.  Eyes: Denied eye symptoms, eye pain, photophobia, vision change and visual disturbance.  Ears/Nose/Throat/Neck: Denied ear, nose, throat or neck symptoms, hearing loss, nasal discharge, sinus congestion  and sore throat.  Cardiovascular: Denied cardiovascular symptoms, arrhythmia, chest pain/pressure, edema, exercise intolerance, orthopnea and palpitations.  Respiratory: Denied pulmonary symptoms, asthma, pleuritic pain, productive sputum, cough, dyspnea and wheezing.  Gastrointestinal: Denied, gastro-esophageal reflux, melena, nausea and vomiting.  Genitourinary: See HPI for additional information.  Musculoskeletal: Denied musculoskeletal symptoms, stiffness, swelling, muscle weakness and myalgia.  Dermatologic: Denied dermatology symptoms, rash and scar.  Neurologic: Denied neurology symptoms, dizziness, headache, neck pain and syncope.  Psychiatric: Denied psychiatric symptoms, anxiety and depression.  Endocrine: Denied endocrine symptoms including hot flashes and night sweats.   Meds:   Current Outpatient Medications on File Prior to Visit  Medication Sig Dispense Refill   fexofenadine (ALLEGRA) 180 MG tablet Take 180 mg by mouth daily.     Multiple Vitamin (MULTIVITAMIN WITH MINERALS) TABS tablet Take 1 tablet by mouth daily.     Multiple Vitamins-Minerals (VITAMIN D3 COMPLETE PO) Take 1 tablet by mouth daily before breakfast.     varenicline (CHANTIX) 1 MG tablet Take 1/2 tablet by mouth once daily for 3 days, then increase to 1/2 tablet twice daily for 4 days, then increase to one tablet twice daily. 56 tablet 0   vitamin B-12 (CYANOCOBALAMIN) 1000 MCG tablet Take 1,000 mcg by mouth daily.      No current facility-administered medications on file prior to visit.       Objective:     Vitals:   12/07/20 1027  BP: 116/79  Pulse: 90    Filed Weights   12/07/20 1027  Weight: 190 lb 4.8 oz (86.3 kg)  Physical examination General NAD, Conversant  HEENT Atraumatic; Op clear with mmm.  Normo-cephalic. Pupils reactive. Anicteric sclerae  Thyroid/Neck Smooth without nodularity or enlargement. Normal ROM.  Neck Supple.  Skin No rashes, lesions or ulceration. Normal  palpated skin turgor. No nodularity.  Breasts: No masses or discharge.  Symmetric.  No axillary adenopathy.  Lungs: Clear to auscultation.No rales or wheezes. Normal Respiratory effort, no retractions.  Heart: NSR.  No murmurs or rubs appreciated. No periferal edema  Abdomen: Soft.  Non-tender.  No masses.  No HSM. No hernia  Extremities: Moves all appropriately.  Normal ROM for age. No lymphadenopathy.  Neuro: Oriented to PPT.  Normal mood. Normal affect.     Pelvic:   Vulva: Normal appearance.  2 cm skin tag noted on left vulva.  2 smaller flat skin tags also noted in approximately the same area.  Vagina: No lesions or abnormalities noted.  Support: Second-degree midline cystocele with some vaginal cuff descensus.  Urethra No masses tenderness or scarring.  Meatus Normal size without lesions or prolapse.  Cervix: Surgically absent  Anus: Normal exam.  No lesions.  Perineum: Normal exam.  No lesions.        Bimanual   Uterus: Surgically absent  Adnexae: No masses.  Non-tender to palpation.  Cul-de-sac: Negative for abnormality.     Assessment:    No obstetric history on file. Patient Active Problem List   Diagnosis Date Noted   H/O total hysterectomy 07/14/2020   [name] 02/19/2020     1. Well woman exam with routine gynecological exam   2. Cystocele, midline   3. Symptomatic menopausal or female climacteric states   4. Skin tag        Plan:            1.  Basic Screening Recommendations The basic screening recommendations for asymptomatic women were discussed with the patient during her visit.  The age-appropriate recommendations were discussed with her and the rational for the tests reviewed.  When I am informed by the patient that another primary care physician has previously obtained the age-appropriate tests and they are up-to-date, only outstanding tests are ordered and referrals given as necessary.  Abnormal results of tests will be discussed with her when  all of her results are completed.  Routine preventative health maintenance measures emphasized: Exercise/Diet/Weight control, Tobacco Warnings, Alcohol/Substance use risks and Stress Management Mammogram ordered (discussed necessity of mammography as patient has not had a baseline) 2.  FSH testing for menopause -if negative would still consider menopausal HRT for symptoms. (Patient smokes cigarettes) 3.  Patient to return for office excision of the larger skin tag.  Orders Orders Placed This Encounter  Procedures   MM DIGITAL SCREENING BILATERAL   Follicle stimulating hormone    No orders of the defined types were placed in this encounter.        F/U  Return in about 2 weeks (around 12/21/2020). I spent 14 extra minutes involved in the care of this patient specifically discussing her menopausal symptoms and her skin tags that she is concerned with in addition to preparing to see the patient by obtaining and reviewing her medical history (including labs, imaging tests and prior procedures), documenting clinical information in the electronic health record (EHR), counseling and coordinating care plans, writing and sending prescriptions, ordering tests or procedures and directly communicating with the patient by discussing pertinent items from her history and physical exam as well as detailing my assessment and plan as noted above so  that she has an informed understanding.  All of her questions were answered.  Elonda Husky, M.D. 12/07/2020 11:01 AM

## 2020-12-08 LAB — FOLLICLE STIMULATING HORMONE: FSH: 66.9 m[IU]/mL

## 2020-12-14 ENCOUNTER — Other Ambulatory Visit: Payer: Self-pay

## 2020-12-21 ENCOUNTER — Encounter: Payer: Self-pay | Admitting: Obstetrics and Gynecology

## 2020-12-21 ENCOUNTER — Ambulatory Visit (INDEPENDENT_AMBULATORY_CARE_PROVIDER_SITE_OTHER): Payer: No Typology Code available for payment source | Admitting: Obstetrics and Gynecology

## 2020-12-21 ENCOUNTER — Other Ambulatory Visit (HOSPITAL_COMMUNITY)
Admission: RE | Admit: 2020-12-21 | Discharge: 2020-12-21 | Disposition: A | Discharge status: Home / Self Care | Payer: Medicaid Other | Source: Ambulatory Visit | Attending: Obstetrics and Gynecology | Admitting: Obstetrics and Gynecology

## 2020-12-21 ENCOUNTER — Other Ambulatory Visit: Payer: Self-pay

## 2020-12-21 VITALS — BP 121/80 | HR 90 | Ht 66.5 in | Wt 194.7 lb

## 2020-12-21 DIAGNOSIS — L918 Other hypertrophic disorders of the skin: Secondary | ICD-10-CM

## 2020-12-21 NOTE — Progress Notes (Signed)
HPI:      [name] is a 45 y.o. No obstetric history on file. who LMP was No LMP recorded. Patient has had a hysterectomy.  Subjective:   She presents today for excision of labial skin tag.  Patient states this has enlarged recently and become irritating.    Hx: The following portions of the patient's history were reviewed and updated as appropriate:             She  has a past medical history of Allergy. She does not have any pertinent problems on file. She  has a past surgical history that includes Abdominal hysterectomy; Cystoscopy with injection (N/A, 09/05/2019); Cesarean section; Botox injection; and bladder tact. Her family history includes Cancer in her father and maternal grandmother. She  reports that she has been smoking cigarettes. She has been smoking an average of 1.5 packs per day. She has never used smokeless tobacco. She reports previous alcohol use. She reports that she does not use drugs. She has a current medication list which includes the following prescription(s): fexofenadine, multivitamin with minerals, multiple vitamins-minerals, varenicline, and vitamin b-12. She has No Known Allergies.       Review of Systems:  Review of Systems  Constitutional: Denied constitutional symptoms, night sweats, recent illness, fatigue, fever, insomnia and weight loss.  Eyes: Denied eye symptoms, eye pain, photophobia, vision change and visual disturbance.  Ears/Nose/Throat/Neck: Denied ear, nose, throat or neck symptoms, hearing loss, nasal discharge, sinus congestion and sore throat.  Cardiovascular: Denied cardiovascular symptoms, arrhythmia, chest pain/pressure, edema, exercise intolerance, orthopnea and palpitations.  Respiratory: Denied pulmonary symptoms, asthma, pleuritic pain, productive sputum, cough, dyspnea and wheezing.  Gastrointestinal: Denied, gastro-esophageal reflux, melena, nausea and vomiting.  Genitourinary: See HPI for additional information.   Musculoskeletal: Denied musculoskeletal symptoms, stiffness, swelling, muscle weakness and myalgia.  Dermatologic: Denied dermatology symptoms, rash and scar.  Neurologic: Denied neurology symptoms, dizziness, headache, neck pain and syncope.  Psychiatric: Denied psychiatric symptoms, anxiety and depression.  Endocrine: Denied endocrine symptoms including hot flashes and night sweats.   Meds:   Current Outpatient Medications on File Prior to Visit  Medication Sig Dispense Refill   fexofenadine (ALLEGRA) 180 MG tablet Take 180 mg by mouth daily.     Multiple Vitamin (MULTIVITAMIN WITH MINERALS) TABS tablet Take 1 tablet by mouth daily.     Multiple Vitamins-Minerals (VITAMIN D3 COMPLETE PO) Take 1 tablet by mouth daily before breakfast.     varenicline (CHANTIX) 1 MG tablet Take 1/2 tablet by mouth once daily for 3 days, then increase to 1/2 tablet twice daily for 4 days, then increase to one tablet twice daily. 56 tablet 0   vitamin B-12 (CYANOCOBALAMIN) 1000 MCG tablet Take 1,000 mcg by mouth daily.      No current facility-administered medications on file prior to visit.      Objective:     Vitals:   12/21/20 1038  BP: 121/80  Pulse: 90   Filed Weights   12/21/20 1038  Weight: 194 lb 11.2 oz (88.3 kg)              Left labial skin tag excision:  Area cleaned with Betadine  Injected with lidocaine for anesthesia  Scalpel excision of skin tag  Silver nitrate to slow bleeding  2 interrupted sutures of 4-0 Vicryl placed  Dermabond for dressing.  Patient tolerated well.   Assessment:    No obstetric history on file. Patient Active Problem List   Diagnosis Date Noted  H/O total hysterectomy 07/14/2020   [name] 02/19/2020     1. Skin tag     Excised without issue   Plan:            1.  Follow-up in 2 weeks  2.  Patient would also like to discuss elevated FSH consistent with menopause at her next visit. Orders No orders of the defined types were  placed in this encounter.   No orders of the defined types were placed in this encounter.     F/U  Return in about 2 weeks (around 01/04/2021). I spent 13 minutes involved in the care of this patient preparing to see the patient by obtaining and reviewing her medical history (including labs, imaging tests and prior procedures), documenting clinical information in the electronic health record (EHR), counseling and coordinating care plans, writing and sending prescriptions, ordering tests or procedures and in direct communicating with the patient and medical staff discussing pertinent items from her history and physical exam.  Elonda Husky, M.D. 12/21/2020 11:59 AM

## 2020-12-21 NOTE — Addendum Note (Signed)
Addended by: Dorian Pod on: 12/21/2020 02:12 PM   Modules accepted: Orders

## 2020-12-22 ENCOUNTER — Encounter: Payer: Self-pay | Admitting: Hematology and Oncology

## 2020-12-25 LAB — SURGICAL PATHOLOGY

## 2021-01-04 ENCOUNTER — Encounter: Payer: No Typology Code available for payment source | Admitting: Obstetrics and Gynecology

## 2021-01-06 ENCOUNTER — Ambulatory Visit (INDEPENDENT_AMBULATORY_CARE_PROVIDER_SITE_OTHER): Payer: No Typology Code available for payment source | Admitting: Obstetrics and Gynecology

## 2021-01-06 ENCOUNTER — Other Ambulatory Visit: Payer: Self-pay

## 2021-01-06 ENCOUNTER — Encounter: Payer: Self-pay | Admitting: Obstetrics and Gynecology

## 2021-01-06 VITALS — BP 103/74 | HR 84 | Resp 16 | Ht 66.0 in | Wt 191.2 lb

## 2021-01-06 DIAGNOSIS — N3946 Mixed incontinence: Secondary | ICD-10-CM

## 2021-01-06 DIAGNOSIS — N951 Menopausal and female climacteric states: Secondary | ICD-10-CM | POA: Diagnosis not present

## 2021-01-06 DIAGNOSIS — N893 Dysplasia of vagina, unspecified: Secondary | ICD-10-CM

## 2021-01-06 NOTE — Progress Notes (Signed)
HPI:      [name] is a 45 y.o. No obstetric history on file. who LMP was No LMP recorded. Patient has had a hysterectomy.  Subjective:   She presents today for follow-up of excision of labial condyloma.  She says she is doing well.  Having no pain, no bleeding, no issues. She does state that she has having issues with menopause.  She is not sleeping well.  She has having hot flashes. She also states that she has begun to leak urine again.  After close questioning it seems more urgent continence than stress incontinence.  She is seeing urology and has previously had "a bladder tack" and multiple injections with Botox.   Hx: The following portions of the patient's history were reviewed and updated as appropriate:             She  has a past medical history of Allergy. She does not have any pertinent problems on file. She  has a past surgical history that includes Abdominal hysterectomy; Cystoscopy with injection (N/A, 09/05/2019); Cesarean section; Botox injection; and bladder tact. Her family history includes Cancer in her father and maternal grandmother. She  reports that she has been smoking cigarettes. She has been smoking an average of 1.5 packs per day. She has never used smokeless tobacco. She reports that she does not currently use alcohol. She reports that she does not use drugs. She has a current medication list which includes the following prescription(s): fexofenadine, multivitamin with minerals, multiple vitamins-minerals, varenicline, and vitamin b-12. She has No Known Allergies.       Review of Systems:  Review of Systems  Constitutional: Denied constitutional symptoms, night sweats, recent illness, fatigue, fever, insomnia and weight loss.  Eyes: Denied eye symptoms, eye pain, photophobia, vision change and visual disturbance.  Ears/Nose/Throat/Neck: Denied ear, nose, throat or neck symptoms, hearing loss, nasal discharge, sinus congestion and sore throat.   Cardiovascular: Denied cardiovascular symptoms, arrhythmia, chest pain/pressure, edema, exercise intolerance, orthopnea and palpitations.  Respiratory: Denied pulmonary symptoms, asthma, pleuritic pain, productive sputum, cough, dyspnea and wheezing.  Gastrointestinal: Denied, gastro-esophageal reflux, melena, nausea and vomiting.  Genitourinary: Denied genitourinary symptoms including symptomatic vaginal discharge, pelvic relaxation issues, and urinary complaints.  Musculoskeletal: Denied musculoskeletal symptoms, stiffness, swelling, muscle weakness and myalgia.  Dermatologic: Denied dermatology symptoms, rash and scar.  Neurologic: Denied neurology symptoms, dizziness, headache, neck pain and syncope.  Psychiatric: Denied psychiatric symptoms, anxiety and depression.  Endocrine: Denied endocrine symptoms including hot flashes and night sweats.   Meds:   Current Outpatient Medications on File Prior to Visit  Medication Sig Dispense Refill   fexofenadine (ALLEGRA) 180 MG tablet Take 180 mg by mouth daily.     Multiple Vitamin (MULTIVITAMIN WITH MINERALS) TABS tablet Take 1 tablet by mouth daily.     Multiple Vitamins-Minerals (VITAMIN D3 COMPLETE PO) Take 1 tablet by mouth daily before breakfast.     varenicline (CHANTIX) 1 MG tablet Take 1/2 tablet by mouth once daily for 3 days, then increase to 1/2 tablet twice daily for 4 days, then increase to one tablet twice daily. 56 tablet 0   vitamin B-12 (CYANOCOBALAMIN) 1000 MCG tablet Take 1,000 mcg by mouth daily.      No current facility-administered medications on file prior to visit.      Objective:     Vitals:   01/06/21 1009  BP: 103/74  Pulse: 84  Resp: 16   Filed Weights   01/06/21 1009  Weight: 191 lb  3.2 oz (86.7 kg)              Examination of the wound shows a well-healed wound.  No issues. Pathology discussed in detail with the patient including diagnosis of vulvar dysplasia.  Assessment:    No obstetric  history on file. Patient Active Problem List   Diagnosis Date Noted   H/O total hysterectomy 07/14/2020   [name] 02/19/2020     No diagnosis found.     Plan:            1.  Expectant management of vulvar lesion.  Likely resolved.-Wound healing well.  2.  Patient would like to schedule a time to discuss menopause and her elevated FSH.  She will consider HRT.  3.  I recommend follow-up with urology-specifically discussing mixed incontinence/bladder dyssynergia. Orders No orders of the defined types were placed in this encounter.   No orders of the defined types were placed in this encounter.     F/U  Return in about 2 weeks (around 01/20/2021). I spent 22 minutes involved in the care of this patient preparing to see the patient by obtaining and reviewing her medical history (including labs, imaging tests and prior procedures), documenting clinical information in the electronic health record (EHR), counseling and coordinating care plans, writing and sending prescriptions, ordering tests or procedures and in direct communicating with the patient and medical staff discussing pertinent items from her history and physical exam.  Elonda Husky, M.D. 01/06/2021 10:46 AM

## 2021-01-20 ENCOUNTER — Ambulatory Visit (INDEPENDENT_AMBULATORY_CARE_PROVIDER_SITE_OTHER): Payer: No Typology Code available for payment source | Admitting: Obstetrics and Gynecology

## 2021-01-20 ENCOUNTER — Encounter: Payer: Self-pay | Admitting: Obstetrics and Gynecology

## 2021-01-20 ENCOUNTER — Other Ambulatory Visit: Payer: Self-pay

## 2021-01-20 VITALS — BP 121/85 | HR 84 | Resp 16 | Ht 66.5 in | Wt 190.0 lb

## 2021-01-20 DIAGNOSIS — N951 Menopausal and female climacteric states: Secondary | ICD-10-CM | POA: Diagnosis not present

## 2021-01-20 MED ORDER — ESTRADIOL 1 MG PO TABS
1.0000 mg | ORAL_TABLET | Freq: Every day | ORAL | 1 refills | Status: DC
  Filled 2021-01-20: qty 90, 90d supply, fill #0
  Filled 2021-05-06: qty 90, 90d supply, fill #1

## 2021-01-20 NOTE — Progress Notes (Signed)
HPI:      [name] is a 45 y.o. No obstetric history on file. who LMP was No LMP recorded. Patient has had a hysterectomy.  Subjective:   She presents today for follow-up of elevated FSH consistent with menopause.  Patient is having hot flashes difficulty sleeping and other menopausal symptoms.  She would like to discuss ERT today. Of significant note patient has had a hysterectomy. She says that the wart removed is doing well.    Hx: The following portions of the patient's history were reviewed and updated as appropriate:             She  has a past medical history of Allergy. She does not have any pertinent problems on file. She  has a past surgical history that includes Abdominal hysterectomy; Cystoscopy with injection (N/A, 09/05/2019); Cesarean section; Botox injection; and bladder tact. Her family history includes Cancer in her father and maternal grandmother. She  reports that she has been smoking cigarettes. She has been smoking an average of 1.5 packs per day. She has never used smokeless tobacco. She reports that she does not currently use alcohol. She reports that she does not use drugs. She has a current medication list which includes the following prescription(s): estradiol, fexofenadine, multivitamin with minerals, multiple vitamins-minerals, varenicline, and vitamin b-12. She has No Known Allergies.       Review of Systems:  Review of Systems  Constitutional: Denied constitutional symptoms, night sweats, recent illness, fatigue, fever, insomnia and weight loss.  Eyes: Denied eye symptoms, eye pain, photophobia, vision change and visual disturbance.  Ears/Nose/Throat/Neck: Denied ear, nose, throat or neck symptoms, hearing loss, nasal discharge, sinus congestion and sore throat.  Cardiovascular: Denied cardiovascular symptoms, arrhythmia, chest pain/pressure, edema, exercise intolerance, orthopnea and palpitations.  Respiratory: Denied pulmonary symptoms, asthma,  pleuritic pain, productive sputum, cough, dyspnea and wheezing.  Gastrointestinal: Denied, gastro-esophageal reflux, melena, nausea and vomiting.  Genitourinary: Denied genitourinary symptoms including symptomatic vaginal discharge, pelvic relaxation issues, and urinary complaints.  Musculoskeletal: Denied musculoskeletal symptoms, stiffness, swelling, muscle weakness and myalgia.  Dermatologic: Denied dermatology symptoms, rash and scar.  Neurologic: Denied neurology symptoms, dizziness, headache, neck pain and syncope.  Psychiatric: Denied psychiatric symptoms, anxiety and depression.  Endocrine: See HPI for additional information.   Meds:   Current Outpatient Medications on File Prior to Visit  Medication Sig Dispense Refill   fexofenadine (ALLEGRA) 180 MG tablet Take 180 mg by mouth daily.     Multiple Vitamin (MULTIVITAMIN WITH MINERALS) TABS tablet Take 1 tablet by mouth daily.     Multiple Vitamins-Minerals (VITAMIN D3 COMPLETE PO) Take 1 tablet by mouth daily before breakfast.     varenicline (CHANTIX) 1 MG tablet Take 1/2 tablet by mouth once daily for 3 days, then increase to 1/2 tablet twice daily for 4 days, then increase to one tablet twice daily. 56 tablet 0   vitamin B-12 (CYANOCOBALAMIN) 1000 MCG tablet Take 1,000 mcg by mouth daily.      No current facility-administered medications on file prior to visit.      Objective:     Vitals:   01/20/21 0954  BP: 121/85  Pulse: 84  Resp: 16   Filed Weights   01/20/21 0954  Weight: 190 lb (86.2 kg)                        Assessment:    No obstetric history on file. Patient Active Problem List   Diagnosis  Date Noted   H/O total hysterectomy 07/14/2020   [name] 02/19/2020     1. Symptomatic menopausal or female climacteric states     Patient menopausal based on [facility] and having symptoms.   Plan:            1.  HRT I have discussed HRT with the patient in detail.  The risk/benefits of it were  reviewed.  She understands that during menopause Estrogen decreases dramatically and that this results in an increased risk of cardiovascular disease as well as osteoporosis.  We have also discussed the fact that hot flashes often result from a decrease in Estrogen, and that by replacing Estrogen, they can often be alleviated.  We have discussed skin, vaginal and urinary tract changes that may also take place from this drop in Estrogen.  Emotional changes have also been linked to Estrogen and we have briefly discussed this.  The benefits of HRT including decrease in hot flashes, vaginal dryness, and osteoporosis were discussed.  The emotional benefit and a possible change in her cardiovascular risk profile was also reviewed.  The risks associated with Hormone Replacement Therapy were also reviewed.  The use of unopposed Estrogen and its relationship to endometrial cancer was discussed.  The addition of Progesterone and its beneficial effect on endometrial cancer was also noted.  The fact that there has been no consistent definitive studies showing an increase in breast cancer in women who use HRT was discussed with the patient.  The possible side effects including breast tenderness, fluid retention, mood changes and vaginal bleeding were discussed.  The patient was informed that this is an elective medication and that she may choose not to take Hormone Replacement Therapy.  Literature on HRT was made available, and I believe that after answering all of the patient's questions.  Special emphasis on the WHI study, as well as several studies since that pertaining to the risks and benefits of estrogen replacement therapy were compared.  The possible limitations of these studies were discussed including the age stratification of the WHI study.  The possible increased role of Progesterone in these studies was discussed in detail.  Different types of hormone formulation and methods of taking hormone replacement therapy  were discussed. Literature on HRT was made available, and I believe that after answering all of the patient's questions she has an adequate and informed understanding of the risks and benefits of HRT. Patient offered both the patch and pill and the differences discussed.  She is interested in taking a pill daily. Will start with 1 mg Estrace. Orders No orders of the defined types were placed in this encounter.    Meds ordered this encounter  Medications   estradiol (ESTRACE) 1 MG tablet    Sig: Take 1 tablet (1 mg total) by mouth daily.    Dispense:  90 tablet    Refill:  1       F/U  Return in about 3 months (around 04/21/2021). I spent 23 minutes involved in the care of this patient preparing to see the patient by obtaining and reviewing her medical history (including labs, imaging tests and prior procedures), documenting clinical information in the electronic health record (EHR), counseling and coordinating care plans, writing and sending prescriptions, ordering tests or procedures and in direct communicating with the patient and medical staff discussing pertinent items from her history and physical exam.  Elonda Husky, M.D. 01/20/2021 10:17 AM

## 2021-01-26 ENCOUNTER — Encounter: Payer: Self-pay | Admitting: Family Medicine

## 2021-01-26 ENCOUNTER — Other Ambulatory Visit: Payer: Self-pay

## 2021-01-26 DIAGNOSIS — F17210 Nicotine dependence, cigarettes, uncomplicated: Secondary | ICD-10-CM

## 2021-01-26 MED ORDER — NICOTINE 14 MG/24HR TD PT24
14.0000 mg | MEDICATED_PATCH | Freq: Every day | TRANSDERMAL | 0 refills | Status: DC
  Filled 2021-01-26: qty 28, 28d supply, fill #0

## 2021-01-26 NOTE — Progress Notes (Signed)
Sent in patches/ nicoderm

## 2021-01-27 ENCOUNTER — Other Ambulatory Visit: Payer: Self-pay

## 2021-01-27 MED ORDER — NICOTINE POLACRILEX 4 MG MT GUM
CHEWING_GUM | OROMUCOSAL | 0 refills | Status: DC
  Filled 2021-01-27: qty 110, 20d supply, fill #0

## 2021-03-09 ENCOUNTER — Other Ambulatory Visit: Payer: Self-pay

## 2021-03-09 ENCOUNTER — Encounter: Payer: Self-pay | Admitting: Family Medicine

## 2021-03-09 ENCOUNTER — Ambulatory Visit (INDEPENDENT_AMBULATORY_CARE_PROVIDER_SITE_OTHER): Payer: No Typology Code available for payment source | Admitting: Family Medicine

## 2021-03-09 VITALS — BP 120/80 | HR 76 | Ht 66.0 in | Wt 193.0 lb

## 2021-03-09 DIAGNOSIS — F17210 Nicotine dependence, cigarettes, uncomplicated: Secondary | ICD-10-CM | POA: Diagnosis not present

## 2021-03-09 DIAGNOSIS — F32A Depression, unspecified: Secondary | ICD-10-CM

## 2021-03-09 DIAGNOSIS — F419 Anxiety disorder, unspecified: Secondary | ICD-10-CM | POA: Diagnosis not present

## 2021-03-09 MED ORDER — BUPROPION HCL ER (SR) 150 MG PO TB12
150.0000 mg | ORAL_TABLET | Freq: Two times a day (BID) | ORAL | 2 refills | Status: DC
  Filled 2021-03-09: qty 60, 30d supply, fill #0
  Filled 2021-04-14: qty 60, 30d supply, fill #1
  Filled 2021-05-11: qty 60, 30d supply, fill #2

## 2021-03-09 NOTE — Progress Notes (Signed)
Date:  03/09/2021   Name:  Anna Cameron   DOB:  [birth year]   MRN:  [id number]   Chief Complaint: Follow-up (Smoking cessation) and Depression (15 and 6- has been on fluoxetine in the past- didn't work)  Depression        This is a chronic problem.  The current episode started more than 1 year ago.   The onset quality is gradual.   The problem occurs daily.  The problem has been gradually worsening since onset.  Associated symptoms include no decreased concentration, no fatigue, no helplessness, no hopelessness, does not have insomnia, not irritable, no restlessness, no decreased interest, no appetite change, no body aches, no myalgias, no headaches, no indigestion, not sad and no suicidal ideas.  Past treatments include nothing.  Compliance with treatment is good.  Previous treatment provided moderate relief.  Lab Results  Component Value Date   CREATININE 0.70 01/24/2020   BUN 14 01/24/2020   NA 143 01/24/2020   K 3.6 01/24/2020   CL 102 01/24/2020   CO2 25 01/24/2020   No results found for: CHOL, HDL, LDLCALC, LDLDIRECT, TRIG, CHOLHDL Lab Results  Component Value Date   TSH 2.210 01/24/2020   No results found for: HGBA1C Lab Results  Component Value Date   WBC 10.0 10/13/2020   HGB 15.6 12/01/2020   HCT 45.6 12/01/2020   MCV 94 10/13/2020   PLT 361 10/13/2020   Lab Results  Component Value Date   ALT 20 01/24/2020   AST 21 01/24/2020   ALKPHOS 71 01/24/2020   BILITOT <0.2 01/24/2020     Review of Systems  Constitutional:  Negative for appetite change, chills, fatigue and fever.  HENT:  Negative for drooling, ear discharge, ear pain and sore throat.   Respiratory:  Negative for cough, shortness of breath and wheezing.   Cardiovascular:  Negative for chest pain, palpitations and leg swelling.  Gastrointestinal:  Negative for abdominal pain, blood in stool, constipation, diarrhea and nausea.  Endocrine: Negative for polydipsia.  Genitourinary:  Negative for  dysuria, frequency, hematuria and urgency.  Musculoskeletal:  Negative for back pain, myalgias and neck pain.  Skin:  Negative for rash.  Allergic/Immunologic: Negative for environmental allergies.  Neurological:  Negative for dizziness and headaches.  Hematological:  Does not bruise/bleed easily.  Psychiatric/Behavioral:  Positive for depression. Negative for decreased concentration and suicidal ideas. The patient is not nervous/anxious and does not have insomnia.    Patient Active Problem List   Diagnosis Date Noted   H/O total hysterectomy 07/14/2020   [name] 02/19/2020    No Known Allergies  Past Surgical History:  Procedure Laterality Date   ABDOMINAL HYSTERECTOMY     bladder tact     BOTOX INJECTION     CESAREAN SECTION     x 3   CYSTOSCOPY WITH INJECTION N/A 09/05/2019   Procedure: CYSTOSCOPY WITH INJECTION BLADDER BOTOX;  Surgeon: Orson Ape, MD;  Location: [facility] ORS;  Service: Urology;  Laterality: N/A;    Social History   Tobacco Use   Smoking status: Every Day    Packs/day: 1.50    Types: Cigarettes   Smokeless tobacco: Never  Substance Use Topics   Alcohol use: Not Currently   Drug use: Never     Medication list has been reviewed and updated.  Current Meds  Medication Sig   estradiol (ESTRACE) 1 MG tablet Take 1 tablet (1 mg total) by mouth daily.   fexofenadine (ALLEGRA) 180 MG tablet  Take 180 mg by mouth daily.   Multiple Vitamin (MULTIVITAMIN WITH MINERALS) TABS tablet Take 1 tablet by mouth daily.   nicotine (NICODERM CQ) 14 mg/24hr patch Place 1 patch (14 mg total) onto the skin daily.   nicotine polacrilex (SM NICOTINE POLACRILEX) 4 MG gum Take by mouth as directed   varenicline (CHANTIX) 1 MG tablet Take 1/2 tablet by mouth once daily for 3 days, then increase to 1/2 tablet twice daily for 4 days, then increase to one tablet twice daily.   vitamin B-12 (CYANOCOBALAMIN) 1000 MCG tablet Take 1,000 mcg by mouth daily.    [DISCONTINUED]  Multiple Vitamins-Minerals (VITAMIN D3 COMPLETE PO) Take 1 tablet by mouth daily before breakfast.    PHQ 2/9 Scores 03/09/2021 07/14/2020 01/24/2020 12/03/2019  PHQ - 2 Score 5 4 2 4   PHQ- 9 Score 15 11 10 9     GAD 7 : Generalized Anxiety Score 03/09/2021 07/14/2020 01/24/2020 12/03/2019  Nervous, Anxious, on Edge 0 0 0 0  Control/stop worrying 0 0 0 0  Worry too much - different things 2 0 0 2  Trouble relaxing 2 0 0 2  Restless 0 0 0 0  Easily annoyed or irritable 1 1 3 1   Afraid - awful might happen 0 0 0 0  Total GAD 7 Score 5 1 3 5   Anxiety Difficulty Not difficult at all Not difficult at all Not difficult at all Not difficult at all    BP Readings from Last 3 Encounters:  03/09/21 120/80  01/20/21 121/85  01/06/21 103/74    Physical Exam Vitals and nursing note reviewed.  Constitutional:      General: She is not irritable.    Appearance: She is well-developed.  HENT:     Head: Normocephalic.     Right Ear: External ear normal.     Left Ear: External ear normal.  Eyes:     General: Lids are everted, no foreign bodies appreciated. No scleral icterus.       Left eye: No foreign body or hordeolum.     Conjunctiva/sclera: Conjunctivae normal.     Right eye: Right conjunctiva is not injected.     Left eye: Left conjunctiva is not injected.     Pupils: Pupils are equal, round, and reactive to light.  Neck:     Thyroid: No thyromegaly.     Vascular: No JVD.     Trachea: No tracheal deviation.  Cardiovascular:     Rate and Rhythm: Normal rate and regular rhythm.     Heart sounds: Normal heart sounds. No murmur heard.   No friction rub. No gallop.  Pulmonary:     Effort: Pulmonary effort is normal. No respiratory distress.     Breath sounds: Normal breath sounds. No wheezing or rales.  Abdominal:     General: Bowel sounds are normal.     Palpations: Abdomen is soft. There is no mass.     Tenderness: There is no abdominal tenderness. There is no guarding or rebound.   Musculoskeletal:        General: No tenderness. Normal range of motion.     Cervical back: Normal range of motion and neck supple.  Lymphadenopathy:     Cervical: No cervical adenopathy.  Skin:    General: Skin is warm.     Findings: No rash.  Neurological:     Mental Status: She is alert and oriented to person, place, and time.     Cranial Nerves: No cranial nerve deficit.  Deep Tendon Reflexes: Reflexes normal.  Psychiatric:        Mood and Affect: Mood is not anxious or depressed.    Wt Readings from Last 3 Encounters:  03/09/21 193 lb (87.5 kg)  01/20/21 190 lb (86.2 kg)  01/06/21 191 lb 3.2 oz (86.7 kg)    BP 120/80   Pulse 76   Ht 5\' 6"  (1.676 m)   Wt 193 lb (87.5 kg)   BMI 31.15 kg/m   Assessment and Plan:  1. Anxiety and depression Chronic.  Persistent.  Relatively stable.  But in need of medication.  Patient previously was on fluoxetine which did not do well on the long run.  We will initiate bupropion 150 mg every 12 hours. - buPROPion (WELLBUTRIN SR) 150 MG 12 hr tablet; Take 1 tablet (150 mg total) by mouth 2 (two) times daily.  Dispense: 60 tablet; Refill: 2  2. Cigarette nicotine dependence without complication Given the patient's bupropion SR 150 mg every 12 hours the patient is to smoke sparingly on the first week and then start decreasing to eventually stop on continuance of dosing. - buPROPion (WELLBUTRIN SR) 150 MG 12 hr tablet; Take 1 tablet (150 mg total) by mouth 2 (two) times daily.  Dispense: 60 tablet; Refill: 2

## 2021-03-09 NOTE — Patient Instructions (Signed)
  Bupropion Sustained-Release Tablets (Smoking Cessation) What is this medication? BUPROPION (byoo PROE pee on) helps you quit smoking. It reduces cravings for nicotine, the addictive substance found in tobacco. It may also help reduce symptoms of withdrawal. It is most effective when used in combination with astop-smoking program. This medicine may be used for other purposes; ask your health care provider orpharmacist if you have questions. COMMON BRAND NAME(S): Buproban, Zyban What should I tell my care team before I take this medication? They need to know if you have any of these conditions: An eating disorder, such as anorexia or bulimia Bipolar disorder or psychosis Diabetes or high blood sugar, treated with medication Glaucoma Head injury or brain tumor Heart disease, previous heart attack, or irregular heart beat High blood pressure Kidney or liver disease Seizures Suicidal thoughts or a previous suicide attempt Tourette's syndrome Weight loss An unusual or allergic reaction to bupropion, other medications, foods, dyes, or preservatives Pregnant or trying to become pregnant Breast-feeding How should I use this medication? Take this medication by mouth with a glass of water. Follow the directions on the prescription label. You can take it with or without food. If it upsets your stomach, take it with food. Do not cut, crush or chew this medication. Take your medication at regular intervals. If you take this medication more than once a day, take your second dose at least 8 hours after you take your first dose. To limit trouble in sleeping, avoid taking this medication at bedtime. Do not take your medication more often than directed. Do not stop taking this medication suddenly except upon the advice of your care team. Stopping thismedication too quickly may cause serious side effects. A special MedGuide will be given to you by the pharmacist with eachprescription and refill. Be sure to  read this information carefully each time. Talk to your care team about the use of this medication in children. Specialcare may be needed. Overdosage: If you think you have taken too much of this medicine contact apoison control center or emergency room at once. NOTE: This medicine is only for you. Do not share this medicine with others. What if I miss a dose? If you miss a dose, skip the missed dose and take your next tablet at the regular time. There should be at least 8 hours between doses. Do not takedouble or extra doses. What may interact with this medication? Do not take this medication with any of the following: Linezolid MAOIs like Azilect, Carbex, Eldepryl, Marplan, Nardil, and Parnate Methylene blue (injected into a vein) Other medications that contain bupropion like Wellbutrin This medication may also interact with the following: Alcohol Certain medications for anxiety or sleep Certain medications for blood pressure like metoprolol, propranolol Certain medications for depression or psychotic disturbances Certain medications for HIV or AIDS like efavirenz, lopinavir, nelfinavir, ritonavir Certain medications for irregular heart beat like propafenone, flecainide Certain medications for Parkinson's disease like amantadine, levodopa Certain medications for seizures like carbamazepine, phenytoin, phenobarbital Cimetidine Clopidogrel Cyclophosphamide Digoxin Furazolidone Isoniazid Nicotine Orphenadrine Procarbazine Steroid medications like prednisone or cortisone Stimulant medications for attention disorders, weight loss, or to stay awake Tamoxifen Theophylline Thiotepa Ticlopidine Tramadol Warfarin This list may not describe all possible interactions. Give your health care provider a list of all the medicines, herbs, non-prescription drugs, or dietary supplements you use. Also tell them if you smoke, drink alcohol, or use illegaldrugs. Some items may interact with your  medicine. What should I watch for while using this medication?   Visit your care team for regular checks on your progress. This medication should be used together with a patient support program. It is important to participate in a behavioral program, counseling, or other support program thatis recommended by your care team. Watch for new or worsening thoughts of suicide or depression. This includes sudden changes in mood, behavior, or thoughts. These changes can happen at any time but are more common in the beginning of treatment or after a change in dose. Call your care team right away if you experience these thoughts orworsening depression. Manic episodes may happen in patients with bipolar disorder who take this medication. Watch for changes in feelings or behaviors such as feeling anxious, nervous, agitated, panicky, irritable, hostile, aggressive, impulsive, severely restless, overly excited and hyperactive, or trouble sleeping. These symptoms can happen at anytime but are more common in the beginning of treatment or after a change in dose. Call your care team right away if you notice any ofthese symptoms. This medication may cause serious skin reactions. They can happen weeks to months after starting the medication. Contact your care team right away if you notice fevers or flu-like symptoms with a rash. The rash may be red or purple and then turn into blisters or peeling of the skin. Or, you might notice a red rash with swelling of the face, lips or lymph nodes in your neck or under yourarms. Avoid drinks that contain alcohol while taking this medication. Drinking large amounts of alcohol, using sleeping or anxiety medications, or quickly stopping the use of these agents while taking this medication may increase your risk fora seizure. Do not drive or use heavy machinery until you know how this medication affectsyou. This medication can impair your ability to perform these tasks. Do not take this  medication close to bedtime. It may prevent you from sleeping. Your mouth may get dry. Chewing sugarless gum or sucking hard candy, and drinking plenty of water may help. Contact your care team if the problem doesnot go away or is severe. Do not use nicotine patches or chewing gum without the advice of your care team while taking this medication. You may need to have your blood pressure taken regularly if your doctor recommends that you use both nicotine and thismedication together. What side effects may I notice from receiving this medication? Side effects that you should report to your care team as soon as possible: Allergic reactions-skin rash, itching, hives, swelling of the face, lips, tongue, or throat Increase in blood pressure Mood and behavior changes-anxiety, nervousness, confusion, hallucinations, irritability, hostility, thoughts of suicide or self-harm, worsening mood, feelings of depression Redness, blistering, peeling, or loosening of the skin, including inside the mouth Seizures Sudden eye pain or change in vision such as blurry vision, seeing halos around lights, vision loss Side effects that usually do not require medical attention (report to your careteam if they continue or are bothersome): Constipation Dizziness Dry mouth Loss of appetite Nausea Tremors or shaking Trouble sleeping This list may not describe all possible side effects. Call your doctor for medical advice about side effects. You may report side effects to FDA at1-800-FDA-1088. Where should I keep my medication? Keep out of the reach of children and pets. Store at room temperature between 20 and 25 degrees C (68 and 77 degrees F). Protect from light. Keep container tightly closed. Throw away any unusedmedication after the expiration date. NOTE: This sheet is a summary. It may not cover all possible information. If you have questions about   this medicine, talk to your doctor, pharmacist, orhealth care  provider.  2022 Elsevier/Gold Standard (2020-07-21 14:40:57)  

## 2021-03-16 ENCOUNTER — Encounter: Payer: Self-pay | Admitting: Physician Assistant

## 2021-03-16 ENCOUNTER — Ambulatory Visit: Payer: Self-pay | Admitting: Physician Assistant

## 2021-03-25 ENCOUNTER — Other Ambulatory Visit: Payer: Self-pay

## 2021-03-25 DIAGNOSIS — N3946 Mixed incontinence: Secondary | ICD-10-CM

## 2021-03-26 ENCOUNTER — Telehealth: Payer: Self-pay | Admitting: Urology

## 2021-03-26 ENCOUNTER — Other Ambulatory Visit: Payer: No Typology Code available for payment source

## 2021-03-26 ENCOUNTER — Other Ambulatory Visit: Payer: Self-pay

## 2021-03-26 DIAGNOSIS — N3946 Mixed incontinence: Secondary | ICD-10-CM

## 2021-03-26 LAB — URINALYSIS, COMPLETE
Bilirubin, UA: NEGATIVE
Glucose, UA: NEGATIVE
Ketones, UA: NEGATIVE
Leukocytes,UA: NEGATIVE
Nitrite, UA: NEGATIVE
Protein,UA: NEGATIVE
RBC, UA: NEGATIVE
Specific Gravity, UA: 1.01 (ref 1.005–1.030)
Urobilinogen, Ur: 0.2 mg/dL (ref 0.2–1.0)
pH, UA: 5.5 (ref 5.0–7.5)

## 2021-03-26 LAB — MICROSCOPIC EXAMINATION: RBC, Urine: NONE SEEN /hpf (ref 0–2)

## 2021-03-26 MED ORDER — CIPROFLOXACIN HCL 250 MG PO TABS
250.0000 mg | ORAL_TABLET | Freq: Two times a day (BID) | ORAL | 0 refills | Status: DC
  Filled 2021-03-26: qty 6, 3d supply, fill #0

## 2021-03-26 NOTE — Telephone Encounter (Signed)
Pt came in to leave urine sample and said the meds she was suppposed to get prior to Botox was put back up and she didn't have it.  It needs to be sent again.

## 2021-03-26 NOTE — Telephone Encounter (Signed)
Medication sent. Sent pt my chart message.

## 2021-03-29 LAB — CULTURE, URINE COMPREHENSIVE

## 2021-04-05 ENCOUNTER — Ambulatory Visit (INDEPENDENT_AMBULATORY_CARE_PROVIDER_SITE_OTHER): Payer: No Typology Code available for payment source | Admitting: Urology

## 2021-04-05 ENCOUNTER — Other Ambulatory Visit: Payer: Self-pay

## 2021-04-05 VITALS — BP 137/83 | HR 85

## 2021-04-05 DIAGNOSIS — N3946 Mixed incontinence: Secondary | ICD-10-CM | POA: Diagnosis not present

## 2021-04-05 MED ORDER — ONABOTULINUMTOXINA 100 UNITS IJ SOLR
100.0000 [IU] | Freq: Once | INTRAMUSCULAR | Status: AC
  Administered 2021-04-05: 100 [IU] via INTRAMUSCULAR

## 2021-04-05 MED ORDER — LIDOCAINE HCL (PF) 2 % IJ SOLN
50.0000 mL | Freq: Once | INTRAMUSCULAR | Status: DC
Start: 2021-04-05 — End: 2021-04-05

## 2021-04-05 MED ORDER — LIDOCAINE HCL 2 % IJ SOLN
50.0000 mL | Freq: Once | INTRAMUSCULAR | Status: AC
Start: 2021-04-05 — End: 2021-04-05
  Administered 2021-04-05: 1000 mg

## 2021-04-05 NOTE — Addendum Note (Signed)
Addended by: Veneta Penton on: 04/05/2021 03:17 PM   Modules accepted: Orders

## 2021-04-05 NOTE — Progress Notes (Signed)
Bladder Instillation  Due to BOTOX patient is present today for a Bladder Instillation of 2 % lidocaine. Patient was cleaned and prepped in a sterile fashion with betadine and lidocaine 2% jelly was instilled into the urethra.  A 16 FR catheter was inserted, urine return was noted 75 ml, urine was yellow in color.  50  ml was instilled into the bladder. The catheter was then removed. Patient tolerated well, no complications were noted Patient held in bladder for 30 minutes prior to procedure starting.   Performed by: Gerarda Gunther, RMA  Follow up/ Additional notes: follow up as directed.

## 2021-04-05 NOTE — Progress Notes (Signed)
04/05/2021 2:00 PM   Anna Cameron 04-17-1976 [id number]  Referring provider: Duanne Limerick, MD [address] [place],  [place] [address]  No chief complaint on file.   HPI: I was consulted to assess the patient's urinary incontinence.  She works here in the hospital.  She has urge incontinence.  She leaks with coughing sneezing.  She would leak with bending lifting her bladder is full.  She had a bladder suspension years ago.  She had Botox in April by another clinician and was nearly 100% dry.  She has no bedwetting.  She wears 2 pads a day moderately wet   She has had a hysterectomy.  She is prone to constipation.  She tried 3 medications in the past which might be Detrol Ditropan and Myrbetriq     Patient has mixed incontinence.  She did very well with Botox.  We explained the role of urodynamics but we decided not to get them at this stage.  We can always change course.  She wants another Botox the last one lasted 7 months.  At some point in time she might go to another treatment but so far is very pleased with it. The other physician is out of network and she would prefer her care here for cost reasons and that we do them in the clinic   Cystoscopy and injection of Botox: Patient underwent flexible cystoscopy.  Bladder close and trigone were normal.  No cystitis.  I injected 100 units of Botox with 10 injections 1 cc/inj.  I was primarily in the midline and just to the left and right [place] along the lateral walls.  I was in the lower half of the bladder.  No bleeding.  Very well-tolerated.   On May 9 patient had Botox.  She saw a nurse practitioner with low residual and still has mixed incontinence Currently wearing 2 pads a day that are damp with a 40 to 50% improvement.  No bedwetting.  Still leaks a little bit with coughing sneezing but does reflux maneuvers to prevent.    Patient expectations and reproducibility of response discussed.  For insurance reasons we will  repeat 100 units of Botox 6 months after the first treatment.  This will be arranged as per protocol.  Call if urine culture positive.  Today Frequency stable.  Incontinence stable. Cystoscopy and Botox: Patient underwent flexible cystoscopy.  Bladder mucosa and trigone were normal.  No cystitis.  The patient had Botox injected.  100 units in 10 cc of normal saline with usual modified template.  Tolerated procedure very well.  No bleeding.  Should be followed as per protocol  PMH: Past Medical History:  Diagnosis Date   Allergy     Surgical History: Past Surgical History:  Procedure Laterality Date   ABDOMINAL HYSTERECTOMY     bladder tact     BOTOX INJECTION     CESAREAN SECTION     x 3   CYSTOSCOPY WITH INJECTION N/A 09/05/2019   Procedure: CYSTOSCOPY WITH INJECTION BLADDER BOTOX;  Surgeon: Orson Ape, MD;  Location: [facility] ORS;  Service: Urology;  Laterality: N/A;    Home Medications:  Allergies as of 04/05/2021   No Known Allergies      Medication List        Accurate as of April 05, 2021  2:00 PM. If you have any questions, ask your nurse or doctor.          buPROPion 150 MG 12 hr  tablet Commonly known as: Wellbutrin SR Take 1 tablet (150 mg total) by mouth 2 (two) times daily.   ciprofloxacin 250 MG tablet Commonly known as: Cipro Take 2 tablets by mouth the day before procedure, 2 tablets the day of procedure, and 2 tablets the day after the procedure. (Take 1 tablet (250 mg total) by mouth 2 (two) times daily. Take 2 tablets the day before procedure. 2 tablets the day of procedure and 2 tablets the day after procedure)   estradiol 1 MG tablet Commonly known as: ESTRACE Take 1 tablet (1 mg total) by mouth daily.   fexofenadine 180 MG tablet Commonly known as: ALLEGRA Take 180 mg by mouth daily.   multivitamin with minerals Tabs tablet Take 1 tablet by mouth daily.   nicotine 14 mg/24hr patch Commonly known as: Nicoderm CQ Place 1 patch (14 mg  total) onto the skin daily.   SM Nicotine Polacrilex 4 MG gum Generic drug: nicotine polacrilex Take by mouth as directed   varenicline 1 MG tablet Commonly known as: CHANTIX Take 1/2 tablet by mouth once daily for 3 days, then increase to 1/2 tablet twice daily for 4 days, then increase to one tablet twice daily. (Take 1/2 tablet by mouth once daily for 3 days, then increase to 1/2 tablet twice daily for 4 days, then increase to one tablet twice daily.)   vitamin B-12 1000 MCG tablet Commonly known as: CYANOCOBALAMIN Take 1,000 mcg by mouth daily.        Allergies: No Known Allergies  Family History: Family History  Problem Relation Age of Onset   Cancer Father    Cancer Maternal Grandmother     Social History:  reports that she has been smoking cigarettes. She has been smoking an average of 1.5 packs per day. She has never used smokeless tobacco. She reports that she does not currently use alcohol. She reports that she does not use drugs.  ROS:                                        Physical Exam: There were no vitals taken for this visit.  Constitutional:  Alert and oriented, No acute distress. HEENT: [place] AT, moist mucus membranes.  Trachea midline, no masses.   Laboratory Data: Lab Results  Component Value Date   WBC 10.0 10/13/2020   HGB 15.6 12/01/2020   HCT 45.6 12/01/2020   MCV 94 10/13/2020   PLT 361 10/13/2020    Lab Results  Component Value Date   CREATININE 0.70 01/24/2020    No results found for: PSA  No results found for: TESTOSTERONE  No results found for: HGBA1C  Urinalysis    Component Value Date/Time   COLORURINE YELLOW 02/19/2020 1518   APPEARANCEUR Hazy (A) 03/26/2021 1106   LABSPEC <1.005 (L) 02/19/2020 1518   LABSPEC 1.003 09/19/2012 1805   PHURINE 5.0 02/19/2020 1518   GLUCOSEU Negative 03/26/2021 1106   GLUCOSEU Negative 09/19/2012 1805   HGBUR NEGATIVE 02/19/2020 1518   BILIRUBINUR Negative  03/26/2021 1106   BILIRUBINUR Negative 09/19/2012 1805   KETONESUR NEGATIVE 02/19/2020 1518   PROTEINUR Negative 03/26/2021 1106   PROTEINUR NEGATIVE 02/19/2020 1518   NITRITE Negative 03/26/2021 1106   NITRITE NEGATIVE 02/19/2020 1518   LEUKOCYTESUR Negative 03/26/2021 1106   LEUKOCYTESUR NEGATIVE 02/19/2020 1518   LEUKOCYTESUR Negative 09/19/2012 1805    Pertinent Imaging:   Assessment & Plan:  Follow patient as per protocol  There are no diagnoses linked to this encounter.  No follow-ups on file.  Martina Sinner, MD  [facility] [address], [address] [place], [place] [address] [phone]

## 2021-04-06 ENCOUNTER — Telehealth: Payer: Self-pay

## 2021-04-06 LAB — MICROSCOPIC EXAMINATION: Epithelial Cells (non renal): >10 /hpf — AB (ref 0–10)

## 2021-04-06 LAB — URINALYSIS, COMPLETE
Bilirubin, UA: NEGATIVE
Glucose, UA: NEGATIVE
Ketones, UA: NEGATIVE
Leukocytes,UA: NEGATIVE
Nitrite, UA: NEGATIVE
Protein,UA: NEGATIVE
Specific Gravity, UA: >1.03 — ABNORMAL HIGH (ref 1.005–1.030)
Urobilinogen, Ur: 0.2 mg/dL (ref 0.2–1.0)
pH, UA: 5.5 (ref 5.0–7.5)

## 2021-04-06 NOTE — Telephone Encounter (Signed)
Called pt post botox to make sure she is going OK and urinating OK. Left message for patient.

## 2021-04-14 ENCOUNTER — Other Ambulatory Visit: Payer: Self-pay

## 2021-04-19 NOTE — Progress Notes (Signed)
04/20/2021 9:14 AM   Anna Cameron June 17, 1975 [id number]  Referring provider: Juline Patch, MD [address] [place],  [place] [address]  Chief Complaint  Patient presents with   Urinary Incontinence   Urological history: 1. Urge incontinence -contributing factors of age, pelvic surgery, smoking, depression and antihistamines -PVR 0 mL -managed with Botox injections (last injection - 04/05/2021)  2. Stress incontinence -contributing factors of age, pelvic surgery, depression, antihistamines -managed with bladder surgery remotely   HPI: Anna Cameron is a 45 y.o. female who presents today for a follow up after Botox.    She underwent Botox injection on 04/05/2021.  Her post procedure course was as expected and uneventful.  Patient denies any modifying or aggravating factors.  Patient denies any gross hematuria, dysuria or suprapubic/flank pain.  Patient denies any fevers, chills, nausea or vomiting.    PVR 0 mL     She states that she feels the Botox has not worked as well as it had in the past.  She is still having episodes of urgency, urge incontinence and stress urinary incontinence.  She states she is making 9-10 trips to the bathroom daily and experiencing nocturia x1.  Patient denies any modifying or aggravating factors.  Patient denies any gross hematuria, dysuria or suprapubic/flank pain.  Patient denies any fevers, chills, nausea or vomiting.    She is continuing to smoke, she drinks 2 L of Dr. Malachi Bonds daily along with 5 bottles of water that are about 16 ounces each.  Her stress incontinence is worsened by the amount of time in between voids.  She is wearing pads daily, but they are mostly for confidence.   PMH: Past Medical History:  Diagnosis Date   Allergy     Surgical History: Past Surgical History:  Procedure Laterality Date   ABDOMINAL HYSTERECTOMY     bladder tact     BOTOX INJECTION     CESAREAN SECTION     x 3   CYSTOSCOPY WITH  INJECTION N/A 09/05/2019   Procedure: CYSTOSCOPY WITH INJECTION BLADDER BOTOX;  Surgeon: Royston Cowper, MD;  Location: [facility] ORS;  Service: Urology;  Laterality: N/A;    Home Medications:  Allergies as of 04/20/2021   No Known Allergies      Medication List        Accurate as of April 20, 2021  9:14 AM. If you have any questions, ask your nurse or doctor.          STOP taking these medications    ciprofloxacin 250 MG tablet Commonly known as: Cipro Stopped by: Faithanne Verret, PA-C       TAKE these medications    buPROPion 150 MG 12 hr tablet Commonly known as: Wellbutrin SR Take 1 tablet (150 mg total) by mouth 2 (two) times daily.   estradiol 1 MG tablet Commonly known as: ESTRACE Take 1 tablet (1 mg total) by mouth daily.   fexofenadine 180 MG tablet Commonly known as: ALLEGRA Take 180 mg by mouth daily.   Gemtesa 75 MG Tabs Generic drug: Vibegron Take 75 mg by mouth daily. Started by: Zara Council, PA-C   multivitamin with minerals Tabs tablet Take 1 tablet by mouth daily.   nicotine 14 mg/24hr patch Commonly known as: Nicoderm CQ Place 1 patch (14 mg total) onto the skin daily.   SM Nicotine Polacrilex 4 MG gum Generic drug: nicotine polacrilex Take by mouth as directed   varenicline 1 MG tablet Commonly known  as: CHANTIX Take 1/2 tablet by mouth once daily for 3 days, then increase to 1/2 tablet twice daily for 4 days, then increase to one tablet twice daily. (Take 1/2 tablet by mouth once daily for 3 days, then increase to 1/2 tablet twice daily for 4 days, then increase to one tablet twice daily.)   vitamin B-12 1000 MCG tablet Commonly known as: CYANOCOBALAMIN Take 1,000 mcg by mouth daily.        Allergies: No Known Allergies  Family History: Family History  Problem Relation Age of Onset   Cancer Father    Cancer Maternal Grandmother     Social History:  reports that she has been smoking cigarettes. She has been smoking  an average of 1.5 packs per day. She has never used smokeless tobacco. She reports that she does not currently use alcohol. She reports that she does not use drugs.  ROS: Pertinent ROS in HPI  Physical Exam: BP 128/84 (BP Location: Left Arm, Patient Position: Sitting, Cuff Size: Large)   Pulse 91   Ht 5\' 6"  (1.676 m)   Wt 192 lb 12.8 oz (87.5 kg)   BMI 31.12 kg/m   Constitutional:  Well nourished. Alert and oriented, No acute distress. HEENT: [place] AT, mask in place.  Trachea midline Cardiovascular: No clubbing, cyanosis, or edema. Respiratory: Normal respiratory effort, no increased work of breathing. Neurologic: Grossly intact, no focal deficits, moving all 4 extremities. Psychiatric: Normal mood and affect.    Laboratory Data: Lab Results  Component Value Date   WBC 10.0 10/13/2020   HGB 15.6 12/01/2020   HCT 45.6 12/01/2020   MCV 94 10/13/2020   PLT 361 10/13/2020  I have reviewed the labs.   Pertinent Imaging: Component     Latest Ref Rng & Units 08/24/2020  Scan Result      0 ml     Assessment & Plan:    1. Urge incontinence -discussed reducing the amount of fluid intake -given samples of Gemtesa 75 mg, # 28 - she will call if medication is effective  2. Stress incontinence -currently managing with pads -she would like a follow up visit with Dr. 10/24/2020 to discuss other treatment options   Return for follow up with Dr. Sherron Monday .  These notes generated with voice recognition software. I apologize for typographical errors.  Sherron Monday, PA-C  [facility] [address]  [address] [place], [place] [phone]

## 2021-04-20 ENCOUNTER — Encounter: Payer: Self-pay | Admitting: Urology

## 2021-04-20 ENCOUNTER — Ambulatory Visit (INDEPENDENT_AMBULATORY_CARE_PROVIDER_SITE_OTHER): Payer: No Typology Code available for payment source | Admitting: Urology

## 2021-04-20 ENCOUNTER — Other Ambulatory Visit: Payer: Self-pay

## 2021-04-20 VITALS — BP 128/84 | HR 91 | Ht 66.0 in | Wt 192.8 lb

## 2021-04-20 DIAGNOSIS — N393 Stress incontinence (female) (male): Secondary | ICD-10-CM | POA: Diagnosis not present

## 2021-04-20 DIAGNOSIS — N3941 Urge incontinence: Secondary | ICD-10-CM | POA: Diagnosis not present

## 2021-04-20 LAB — BLADDER SCAN AMB NON-IMAGING

## 2021-04-20 MED ORDER — GEMTESA 75 MG PO TABS: 75.0000 mg | ORAL_TABLET | Freq: Every day | ORAL | 0 refills | Status: DC

## 2021-04-21 ENCOUNTER — Encounter: Payer: No Typology Code available for payment source | Admitting: Obstetrics and Gynecology

## 2021-04-23 ENCOUNTER — Encounter: Payer: Self-pay | Admitting: Obstetrics and Gynecology

## 2021-05-04 ENCOUNTER — Encounter: Payer: No Typology Code available for payment source | Admitting: Obstetrics and Gynecology

## 2021-05-06 ENCOUNTER — Other Ambulatory Visit: Payer: Self-pay

## 2021-05-11 ENCOUNTER — Other Ambulatory Visit: Payer: Self-pay

## 2021-05-11 DIAGNOSIS — N3941 Urge incontinence: Secondary | ICD-10-CM

## 2021-05-11 MED ORDER — GEMTESA 75 MG PO TABS
75.0000 mg | ORAL_TABLET | Freq: Every day | ORAL | 11 refills | Status: DC
  Filled 2021-05-11: qty 30, 30d supply, fill #0
  Filled 2021-05-26: qty 90, 90d supply, fill #0
  Filled 2021-09-01: qty 90, 90d supply, fill #1
  Filled 2021-11-26: qty 90, 90d supply, fill #2
  Filled 2022-03-23: qty 90, 90d supply, fill #3

## 2021-05-12 ENCOUNTER — Other Ambulatory Visit: Payer: Self-pay

## 2021-05-18 ENCOUNTER — Encounter: Payer: Self-pay | Admitting: Hematology and Oncology

## 2021-05-18 ENCOUNTER — Other Ambulatory Visit
Admission: RE | Admit: 2021-05-18 | Discharge: 2021-05-18 | Disposition: A | Discharge status: Home / Self Care | Payer: No Typology Code available for payment source | Attending: Family Medicine | Admitting: Family Medicine

## 2021-05-18 ENCOUNTER — Ambulatory Visit (INDEPENDENT_AMBULATORY_CARE_PROVIDER_SITE_OTHER): Payer: No Typology Code available for payment source | Admitting: Family Medicine

## 2021-05-18 ENCOUNTER — Other Ambulatory Visit: Payer: Self-pay

## 2021-05-18 ENCOUNTER — Encounter: Payer: Self-pay | Admitting: Family Medicine

## 2021-05-18 VITALS — BP 120/62 | HR 68 | Ht 66.0 in | Wt 186.0 lb

## 2021-05-18 DIAGNOSIS — R195 Other fecal abnormalities: Secondary | ICD-10-CM | POA: Diagnosis not present

## 2021-05-18 DIAGNOSIS — R5381 Other malaise: Secondary | ICD-10-CM | POA: Diagnosis not present

## 2021-05-18 DIAGNOSIS — R5383 Other fatigue: Secondary | ICD-10-CM

## 2021-05-18 DIAGNOSIS — E860 Dehydration: Secondary | ICD-10-CM | POA: Diagnosis not present

## 2021-05-18 DIAGNOSIS — F419 Anxiety disorder, unspecified: Secondary | ICD-10-CM

## 2021-05-18 DIAGNOSIS — F32A Depression, unspecified: Secondary | ICD-10-CM

## 2021-05-18 DIAGNOSIS — R197 Diarrhea, unspecified: Secondary | ICD-10-CM

## 2021-05-18 DIAGNOSIS — R634 Abnormal weight loss: Secondary | ICD-10-CM

## 2021-05-18 LAB — CBC WITH DIFFERENTIAL/PLATELET
Abs Immature Granulocytes: 0.02 10*3/uL (ref 0.00–0.07)
Basophils Absolute: 0 10*3/uL (ref 0.0–0.1)
Basophils Relative: 0 %
Eosinophils Absolute: 0.1 10*3/uL (ref 0.0–0.5)
Eosinophils Relative: 2 %
HCT: 50.3 % — ABNORMAL HIGH (ref 36.0–46.0)
Hemoglobin: 16.9 g/dL — ABNORMAL HIGH (ref 12.0–15.0)
Immature Granulocytes: 0 %
Lymphocytes Relative: 15 %
Lymphs Abs: 0.9 10*3/uL (ref 0.7–4.0)
MCH: 31.4 pg (ref 26.0–34.0)
MCHC: 33.6 g/dL (ref 30.0–36.0)
MCV: 93.3 fL (ref 80.0–100.0)
Monocytes Absolute: 0.7 10*3/uL (ref 0.1–1.0)
Monocytes Relative: 11 %
Neutro Abs: 4.4 10*3/uL (ref 1.7–7.7)
Neutrophils Relative %: 72 %
Platelets: 242 10*3/uL (ref 150–400)
RBC: 5.39 MIL/uL — ABNORMAL HIGH (ref 3.87–5.11)
RDW: 12 % (ref 11.5–15.5)
WBC: 6.2 10*3/uL (ref 4.0–10.5)
nRBC: 0 % (ref 0.0–0.2)

## 2021-05-18 LAB — RENAL FUNCTION PANEL
Albumin: 4 g/dL (ref 3.5–5.0)
Anion gap: 6 (ref 5–15)
BUN: 20 mg/dL (ref 6–20)
CO2: 27 mmol/L (ref 22–32)
Calcium: 8.5 mg/dL — ABNORMAL LOW (ref 8.9–10.3)
Chloride: 105 mmol/L (ref 98–111)
Creatinine, Ser: 0.83 mg/dL (ref 0.44–1.00)
GFR, Estimated: >60 mL/min (ref >60)
Glucose, Bld: 102 mg/dL — ABNORMAL HIGH (ref 70–99)
Phosphorus: 2.8 mg/dL (ref 2.5–4.6)
Potassium: 3.8 mmol/L (ref 3.5–5.1)
Sodium: 138 mmol/L (ref 135–145)

## 2021-05-18 NOTE — Progress Notes (Signed)
Date:  05/18/2021   Name:  Anna Cameron   DOB:  1976-04-17   MRN:  [id number]   Chief Complaint: Anxiety  Anxiety Presents for follow-up visit. Symptoms include insomnia and nervous/anxious behavior. Patient reports no chest pain, compulsions, confusion, decreased concentration, depressed mood, dizziness, dry mouth, excessive worry, feeling of choking, hyperventilation, impotence, irritability, malaise, muscle tension, nausea, obsessions, palpitations, panic, restlessness, shortness of breath or suicidal ideas. The severity of symptoms is mild.    Depression        This is a chronic problem.  The current episode started more than 1 year ago.   The problem occurs intermittently.  The problem has been waxing and waning since onset.  Associated symptoms include fatigue, helplessness, insomnia, irritable, decreased interest and sad.  Associated symptoms include no decreased concentration, no restlessness and no suicidal ideas.     The symptoms are aggravated by family issues.  Past treatments include other medications.  Compliance with treatment is variable.  Previous treatment provided mild relief.  Past medical history includes anxiety.    Lab Results  Component Value Date   NA 143 01/24/2020   K 3.6 01/24/2020   CO2 25 01/24/2020   GLUCOSE 73 02/10/2020   BUN 14 01/24/2020   CREATININE 0.70 01/24/2020   CALCIUM 9.4 01/24/2020   GFRNONAA 106 01/24/2020   No results found for: CHOL, HDL, LDLCALC, LDLDIRECT, TRIG, CHOLHDL Lab Results  Component Value Date   TSH 2.210 01/24/2020   No results found for: HGBA1C Lab Results  Component Value Date   WBC 10.0 10/13/2020   HGB 15.6 12/01/2020   HCT 45.6 12/01/2020   MCV 94 10/13/2020   PLT 361 10/13/2020   Lab Results  Component Value Date   ALT 20 01/24/2020   AST 21 01/24/2020   ALKPHOS 71 01/24/2020   BILITOT <0.2 01/24/2020   No results found for: 25OHVITD2, 25OHVITD3, VD25OH   Review of Systems  Constitutional:   Positive for fatigue. Negative for fever and irritability.  Respiratory:  Negative for shortness of breath.   Cardiovascular:  Negative for chest pain and palpitations.  Gastrointestinal:  Positive for diarrhea. Negative for abdominal pain, anal bleeding, blood in stool and nausea.  Genitourinary:  Negative for impotence and menstrual problem.  Neurological:  Negative for dizziness.  Psychiatric/Behavioral:  Positive for depression. Negative for confusion, decreased concentration and suicidal ideas. The patient is nervous/anxious and has insomnia.    Patient Active Problem List   Diagnosis Date Noted   H/O total hysterectomy 07/14/2020   [name] 02/19/2020    No Known Allergies  Past Surgical History:  Procedure Laterality Date   ABDOMINAL HYSTERECTOMY     bladder tact     BOTOX INJECTION     CESAREAN SECTION     x 3   CYSTOSCOPY WITH INJECTION N/A 09/05/2019   Procedure: CYSTOSCOPY WITH INJECTION BLADDER BOTOX;  Surgeon: Orson Ape, MD;  Location: [facility] ORS;  Service: Urology;  Laterality: N/A;    Social History   Tobacco Use   Smoking status: Every Day    Packs/day: 1.50    Types: Cigarettes   Smokeless tobacco: Never  Substance Use Topics   Alcohol use: Not Currently   Drug use: Never     Medication list has been reviewed and updated.  Current Meds  Medication Sig   buPROPion (WELLBUTRIN SR) 150 MG 12 hr tablet Take 1 tablet (150 mg total) by mouth 2 (two) times daily.   estradiol (  ESTRACE) 1 MG tablet Take 1 tablet (1 mg total) by mouth daily.   fexofenadine (ALLEGRA) 180 MG tablet Take 180 mg by mouth daily.   Multiple Vitamin (MULTIVITAMIN WITH MINERALS) TABS tablet Take 1 tablet by mouth daily.   Vibegron (GEMTESA) 75 MG TABS Take 75 mg by mouth daily.   vitamin B-12 (CYANOCOBALAMIN) 1000 MCG tablet Take 1,000 mcg by mouth daily.     PHQ 2/9 Scores 05/18/2021 03/09/2021 07/14/2020 01/24/2020  PHQ - 2 Score 2 5 4 2   PHQ- 9 Score 13 15 11 10      GAD 7 : Generalized Anxiety Score 05/18/2021 03/09/2021 07/14/2020 01/24/2020  Nervous, Anxious, on Edge 0 0 0 0  Control/stop worrying 0 0 0 0  Worry too much - different things 0 2 0 0  Trouble relaxing 1 2 0 0  Restless 1 0 0 0  Easily annoyed or irritable 3 1 1 3   Afraid - awful might happen 0 0 0 0  Total GAD 7 Score 5 5 1 3   Anxiety Difficulty Not difficult at all Not difficult at all Not difficult at all Not difficult at all    BP Readings from Last 3 Encounters:  05/18/21 120/62  04/20/21 128/84  04/05/21 137/83    Physical Exam Vitals and nursing note reviewed.  Constitutional:      General: She is irritable. She is not in acute distress.    Appearance: She is not diaphoretic.  HENT:     Head: Normocephalic and atraumatic.     Right Ear: Tympanic membrane and external ear normal.     Left Ear: Tympanic membrane and external ear normal.     Nose: Nose normal.     Mouth/Throat:     Mouth: Mucous membranes are dry.     Tongue: No lesions.     Palate: No lesions.     Pharynx: Oropharynx is clear.  Eyes:     General:        Right eye: No discharge.        Left eye: No discharge.     Conjunctiva/sclera: Conjunctivae normal.     Pupils: Pupils are equal, round, and reactive to light.  Neck:     Thyroid: No thyromegaly.     Vascular: No JVD.  Cardiovascular:     Rate and Rhythm: Normal rate and regular rhythm.     Heart sounds: Normal heart sounds. No murmur heard.   No friction rub. No gallop.  Pulmonary:     Effort: Pulmonary effort is normal.     Breath sounds: Normal breath sounds.  Abdominal:     General: Bowel sounds are normal.     Palpations: Abdomen is soft. There is no mass.     Tenderness: There is no abdominal tenderness. There is no guarding.  Genitourinary:    Rectum: Normal. Guaiac result positive. No mass or tenderness.     Comments: Trace positive Musculoskeletal:        General: Normal range of motion.     Cervical back: Normal range of  motion and neck supple.  Lymphadenopathy:     Cervical: No cervical adenopathy.  Skin:    General: Skin is warm and dry.     Coloration: Skin is pale.  Neurological:     Mental Status: She is alert.    Wt Readings from Last 3 Encounters:  05/18/21 186 lb (84.4 kg)  04/20/21 192 lb 12.8 oz (87.5 kg)  03/09/21 193 lb (87.5 kg)  BP 120/62    Pulse 68    Ht 5\' 6"  (1.676 m)    Wt 186 lb (84.4 kg)    BMI 30.02 kg/m   Assessment and Plan:  1. Anxiety and depression Chronic.  Episodic.  Currently elevated PHQ 213 with a gad score of 5.  Patient is currently on bupropion SR 150 mg once a day.  Will refer to CBC for evaluation and treatment.  2. Malaise and fatigue New onset.  Persistent.  Patient's had for approximately a week as she has had diarrhea.  There has been no melena or hematochezia.  However patient has taken ibuprofen "all her life" for headaches and this brings up concern for a possible upper GI bleed.  3. Dehydration New onset.  Decreased mucous membrane moisture.  Consistent with dehydration and patient has been encouraged to push the fluids.  4. Diarrhea, unspecified type New onset.  Stated been persistent.  Blood pressure is 120/62.  Guaiac was noted to be trace positive.  Patient also donates blood and we need to talk about that again.  Patient has not tried any over-the-counter preparations and Imodium has been suggested and significant fluid rehydration has been discussed as well. 5. Weight loss, non-intentional Patient's had a 7 pound nonintentional weight loss consistent with the fluid loss during diarrhea.  Fluid rehydration has been discussed with patient with Gatorade.  We will check a renal panel to see if there is electrolyte concerns Patient donates blood on a fairly frequent basis probably for platelets and I have concerns that this may be contributing to her malaise.

## 2021-05-20 ENCOUNTER — Telehealth: Payer: Self-pay | Admitting: Family Medicine

## 2021-05-20 NOTE — Telephone Encounter (Signed)
Copied from CRM [id number]. Topic: Referral - Status >> May 20, 2021  9:07 AM Crist Infante wrote: Reason for CRM: Vernona Rieger w/ [facility] states both of pt's insurance are out of network with them.  They will not be able to see this pt.

## 2021-05-25 ENCOUNTER — Telehealth: Payer: No Typology Code available for payment source | Admitting: Physician Assistant

## 2021-05-25 DIAGNOSIS — R21 Rash and other nonspecific skin eruption: Secondary | ICD-10-CM

## 2021-05-25 NOTE — Progress Notes (Signed)
Based on what you shared with me, I feel your condition warrants further evaluation and I recommend that you be seen in a face to face visit.  It is hard to truly evaluate this rash via a virtual visit. I do feel this would be best evaluated in person. There are multiple things including: herpes, staph infection, hand/foot/mouth, impetigo, erythema multiforme, or even medication reactions, that this rash could be. To get the best evaluation and treatment I would recommend to be seen urgently today.    NOTE: There will be NO CHARGE for this eVisit   If you are having a true medical emergency please call 911.      For an urgent face to face visit, [place] has six urgent care centers for your convenience:     [facility] [place] Get Driving Directions [phone] [place], [place] [address]    [facility]) Get Driving Directions [phone] [place], [place] [address]  [facility]) Get Driving Directions [phone] [address] [place],  [place]  [address]  [facility] Get Driving Directions [phone] [place], [place], [place] [address]   [facility] Get Driving Directions  [phone] [address] [place], [place] [address]    [facility] [place] Get Driving Directions [phone] [address], [place], [place] [address]  Your MyChart E-visit questionnaire answers were reviewed by a board certified advanced clinical practitioner to complete your personal care plan based on your specific symptoms.  Thank you for using e-Visits.   I provided 5 minutes of non face-to-face time during this encounter for chart review and documentation.

## 2021-05-26 ENCOUNTER — Other Ambulatory Visit: Payer: Self-pay

## 2021-05-26 ENCOUNTER — Encounter: Payer: Self-pay | Admitting: Family Medicine

## 2021-05-26 ENCOUNTER — Ambulatory Visit (INDEPENDENT_AMBULATORY_CARE_PROVIDER_SITE_OTHER): Payer: No Typology Code available for payment source | Admitting: Family Medicine

## 2021-05-26 ENCOUNTER — Encounter: Payer: Self-pay | Admitting: Hematology and Oncology

## 2021-05-26 VITALS — BP 130/80 | HR 68 | Ht 66.0 in | Wt 193.0 lb

## 2021-05-26 DIAGNOSIS — B029 Zoster without complications: Secondary | ICD-10-CM | POA: Diagnosis not present

## 2021-05-26 MED ORDER — VALACYCLOVIR HCL 1 G PO TABS
1000.0000 mg | ORAL_TABLET | Freq: Three times a day (TID) | ORAL | 0 refills | Status: AC
  Filled 2021-05-26: qty 21, 7d supply, fill #0

## 2021-05-26 MED ORDER — PREDNISONE 10 MG PO TABS
10.0000 mg | ORAL_TABLET | Freq: Every day | ORAL | 0 refills | Status: DC
  Filled 2021-05-26: qty 10, 10d supply, fill #0

## 2021-05-26 NOTE — Progress Notes (Signed)
Date:  05/26/2021   Name:  Anna Cameron   DOB:  [birth year]   MRN:  [id number]   Chief Complaint: Rash (Purple and blistered in palm of L) hand- noticed it a week ago)  Rash This is a new problem. The current episode started 1 to 4 weeks ago (9 days). The problem has been gradually improving since onset. The affected locations include the left hand. The rash is characterized by blistering. Associated with: had varicella as child. Pertinent negatives include no cough, diarrhea, fever, shortness of breath or sore throat. (Pain radiating median) Past treatments include nothing.   Lab Results  Component Value Date   NA 138 05/18/2021   K 3.8 05/18/2021   CO2 27 05/18/2021   GLUCOSE 102 (H) 05/18/2021   BUN 20 05/18/2021   CREATININE 0.83 05/18/2021   CALCIUM 8.5 (L) 05/18/2021   GFRNONAA >60 05/18/2021   No results found for: CHOL, HDL, LDLCALC, LDLDIRECT, TRIG, CHOLHDL Lab Results  Component Value Date   TSH 2.210 01/24/2020   No results found for: HGBA1C Lab Results  Component Value Date   WBC 6.2 05/18/2021   HGB 16.9 (H) 05/18/2021   HCT 50.3 (H) 05/18/2021   MCV 93.3 05/18/2021   PLT 242 05/18/2021   Lab Results  Component Value Date   ALT 20 01/24/2020   AST 21 01/24/2020   ALKPHOS 71 01/24/2020   BILITOT <0.2 01/24/2020   No results found for: 25OHVITD2, 25OHVITD3, VD25OH   Review of Systems  Constitutional:  Negative for chills and fever.  HENT:  Negative for drooling, ear discharge, ear pain and sore throat.   Respiratory:  Negative for cough, shortness of breath and wheezing.   Cardiovascular:  Negative for chest pain, palpitations and leg swelling.  Gastrointestinal:  Negative for abdominal pain, blood in stool, constipation, diarrhea and nausea.  Endocrine: Negative for polydipsia.  Genitourinary:  Negative for dysuria, frequency, hematuria and urgency.  Musculoskeletal:  Negative for back pain, myalgias and neck pain.  Skin:  Positive for rash.   Allergic/Immunologic: Negative for environmental allergies.  Neurological:  Negative for dizziness and headaches.  Hematological:  Does not bruise/bleed easily.  Psychiatric/Behavioral:  Negative for suicidal ideas. The patient is not nervous/anxious.    Patient Active Problem List   Diagnosis Date Noted   H/O total hysterectomy 07/14/2020   [name] 02/19/2020    No Known Allergies  Past Surgical History:  Procedure Laterality Date   ABDOMINAL HYSTERECTOMY     bladder tact     BOTOX INJECTION     CESAREAN SECTION     x 3   CYSTOSCOPY WITH INJECTION N/A 09/05/2019   Procedure: CYSTOSCOPY WITH INJECTION BLADDER BOTOX;  Surgeon: Orson Ape, MD;  Location: [facility] ORS;  Service: Urology;  Laterality: N/A;    Social History   Tobacco Use   Smoking status: Every Day    Packs/day: 1.50    Types: Cigarettes   Smokeless tobacco: Never  Substance Use Topics   Alcohol use: Not Currently   Drug use: Never     Medication list has been reviewed and updated.  Current Meds  Medication Sig   buPROPion (WELLBUTRIN SR) 150 MG 12 hr tablet Take 1 tablet (150 mg total) by mouth 2 (two) times daily.   estradiol (ESTRACE) 1 MG tablet Take 1 tablet (1 mg total) by mouth daily.   fexofenadine (ALLEGRA) 180 MG tablet Take 180 mg by mouth daily.   Multiple Vitamin (MULTIVITAMIN WITH MINERALS)  TABS tablet Take 1 tablet by mouth daily.   Vibegron (GEMTESA) 75 MG TABS Take 75 mg by mouth daily.   vitamin B-12 (CYANOCOBALAMIN) 1000 MCG tablet Take 1,000 mcg by mouth daily.     PHQ 2/9 Scores 05/18/2021 03/09/2021 07/14/2020 01/24/2020  PHQ - 2 Score 2 5 4 2   PHQ- 9 Score 13 15 11 10     GAD 7 : Generalized Anxiety Score 05/18/2021 03/09/2021 07/14/2020 01/24/2020  Nervous, Anxious, on Edge 0 0 0 0  Control/stop worrying 0 0 0 0  Worry too much - different things 0 2 0 0  Trouble relaxing 1 2 0 0  Restless 1 0 0 0  Easily annoyed or irritable 3 1 1 3   Afraid - awful might happen 0 0  0 0  Total GAD 7 Score 5 5 1 3   Anxiety Difficulty Not difficult at all Not difficult at all Not difficult at all Not difficult at all    BP Readings from Last 3 Encounters:  05/26/21 130/80  05/18/21 120/62  04/20/21 128/84    Physical Exam Vitals and nursing note reviewed.  Constitutional:      Appearance: She is well-developed.  HENT:     Head: Normocephalic.     Right Ear: External ear normal.     Left Ear: External ear normal.  Eyes:     General: Lids are everted, no foreign bodies appreciated. No scleral icterus.       Left eye: No foreign body or hordeolum.     Conjunctiva/sclera: Conjunctivae normal.     Right eye: Right conjunctiva is not injected.     Left eye: Left conjunctiva is not injected.     Pupils: Pupils are equal, round, and reactive to light.  Neck:     Thyroid: No thyromegaly.     Vascular: No JVD.     Trachea: No tracheal deviation.  Cardiovascular:     Rate and Rhythm: Normal rate and regular rhythm.     Heart sounds: Normal heart sounds. No murmur heard.   No friction rub. No gallop.  Pulmonary:     Effort: Pulmonary effort is normal. No respiratory distress.     Breath sounds: Normal breath sounds. No wheezing or rales.  Abdominal:     General: Bowel sounds are normal.     Palpations: Abdomen is soft. There is no mass.     Tenderness: There is no abdominal tenderness. There is no guarding or rebound.  Musculoskeletal:        General: No tenderness. Normal range of motion.     Cervical back: Normal range of motion and neck supple.  Lymphadenopathy:     Cervical: No cervical adenopathy.  Skin:    General: Skin is warm.     Findings: Rash present. Rash is vesicular.     Comments: cluster  Neurological:     Mental Status: She is alert and oriented to person, place, and time.     Cranial Nerves: No cranial nerve deficit.     Deep Tendon Reflexes: Reflexes normal.  Psychiatric:        Mood and Affect: Mood is not anxious or depressed.     Wt Readings from Last 3 Encounters:  05/26/21 193 lb (87.5 kg)  05/18/21 186 lb (84.4 kg)  04/20/21 192 lb 12.8 oz (87.5 kg)    BP 130/80    Pulse 68    Ht 5\' 6"  (1.676 m)    Wt 193 lb (87.5 kg)  BMI 31.15 kg/m   Assessment and Plan:  1. Herpes zoster without complication New onset.  Persistent.  Happened about a week ago.  Patient has a cluster of blisters in the palm of her hand with pain extending into the medial and nerve distribution.  This is likely zoster and we will treat with Valtrex 1 g 3 times a day for 7 days and prednisone once a day. - valACYclovir (VALTREX) 1000 MG tablet; Take 1 tablet (1,000 mg total) by mouth 3 (three) times daily for 7 days.  Dispense: 21 tablet; Refill: 0 - predniSONE (DELTASONE) 10 MG tablet; Take 1 tablet (10 mg total) by mouth daily with breakfast.  Dispense: 10 tablet; Refill: 0

## 2021-05-27 ENCOUNTER — Other Ambulatory Visit: Payer: Self-pay

## 2021-06-03 ENCOUNTER — Telehealth: Payer: Self-pay

## 2021-06-03 NOTE — Telephone Encounter (Signed)
Called and left message for pt to go get renal panel done that was ordered at last visit

## 2021-06-14 ENCOUNTER — Encounter: Payer: Self-pay | Admitting: Hematology and Oncology

## 2021-06-14 ENCOUNTER — Ambulatory Visit (INDEPENDENT_AMBULATORY_CARE_PROVIDER_SITE_OTHER): Payer: No Typology Code available for payment source | Admitting: Urology

## 2021-06-14 ENCOUNTER — Other Ambulatory Visit: Payer: Self-pay

## 2021-06-14 ENCOUNTER — Encounter: Payer: Self-pay | Admitting: Urology

## 2021-06-14 VITALS — BP 136/85 | HR 92 | Ht 66.0 in | Wt 193.0 lb

## 2021-06-14 DIAGNOSIS — N3941 Urge incontinence: Secondary | ICD-10-CM | POA: Diagnosis not present

## 2021-06-14 DIAGNOSIS — N3946 Mixed incontinence: Secondary | ICD-10-CM | POA: Diagnosis not present

## 2021-06-14 LAB — BLADDER SCAN AMB NON-IMAGING: Scan Result: 0

## 2021-06-14 NOTE — Progress Notes (Signed)
06/14/2021 8:58 AM   Anna Cameron Apr 01, 1976 [id number]  Referring provider: Duanne Limerick, MD [address] [place],  [place] [address]  Chief Complaint  Patient presents with   Follow-up    HPI: I reviewed chart.  Patient has refractory urge incontinence and mild stress incontinence.  She has had a number of Botox treatments.  Her last Botox treatment was in November 2022.  She is on [place] Botox treatment worked very well.  She is a nurse here in the hospital.  Having said that she still felt some urgency so is doing beautifully on [place].  She still wants regular treatments.  Clinically not infected frequency stable   PMH: Past Medical History:  Diagnosis Date   Allergy     Surgical History: Past Surgical History:  Procedure Laterality Date   ABDOMINAL HYSTERECTOMY     bladder tact     BOTOX INJECTION     CESAREAN SECTION     x 3   CYSTOSCOPY WITH INJECTION N/A 09/05/2019   Procedure: CYSTOSCOPY WITH INJECTION BLADDER BOTOX;  Surgeon: Orson Ape, MD;  Location: [facility] ORS;  Service: Urology;  Laterality: N/A;    Home Medications:  Allergies as of 06/14/2021   No Known Allergies      Medication List        Accurate as of June 14, 2021  8:58 AM. If you have any questions, ask your nurse or doctor.          buPROPion 150 MG 12 hr tablet Commonly known as: Wellbutrin SR Take 1 tablet (150 mg total) by mouth 2 (two) times daily.   estradiol 1 MG tablet Commonly known as: ESTRACE Take 1 tablet (1 mg total) by mouth daily.   fexofenadine 180 MG tablet Commonly known as: ALLEGRA Take 180 mg by mouth daily.   Gemtesa 75 MG Tabs Generic drug: Vibegron Take 1 tablet by mouth daily (Take 75 mg by mouth daily.)   multivitamin with minerals Tabs tablet Take 1 tablet by mouth daily.   nicotine 14 mg/24hr patch Commonly known as: Nicoderm CQ Place 1 patch (14 mg total) onto the skin daily.   predniSONE 10 MG  tablet Commonly known as: DELTASONE Take 1 tablet (10 mg total) by mouth daily with breakfast.   SM Nicotine Polacrilex 4 MG gum Generic drug: nicotine polacrilex Take by mouth as directed   varenicline 1 MG tablet Commonly known as: CHANTIX Take 1/2 tablet by mouth once daily for 3 days, then increase to 1/2 tablet twice daily for 4 days, then increase to one tablet twice daily. (Take 1/2 tablet by mouth once daily for 3 days, then increase to 1/2 tablet twice daily for 4 days, then increase to one tablet twice daily.)   vitamin B-12 1000 MCG tablet Commonly known as: CYANOCOBALAMIN Take 1,000 mcg by mouth daily.        Allergies: No Known Allergies  Family History: Family History  Problem Relation Age of Onset   Cancer Father    Cancer Maternal Grandmother     Social History:  reports that she has been smoking cigarettes. She has been smoking an average of 1.5 packs per day. She has never used smokeless tobacco. She reports that she does not currently use alcohol. She reports that she does not use drugs.  ROS:  Physical Exam: BP 136/85    Pulse 92    Ht 5\' 6"  (1.676 m)    Wt 87.5 kg    BMI 31.15 kg/m   Constitutional:  Alert and oriented, No acute distress. HEENT: [place] AT, moist mucus membranes.  Trachea midline, no masses.   Laboratory Data: Lab Results  Component Value Date   WBC 6.2 05/18/2021   HGB 16.9 (H) 05/18/2021   HCT 50.3 (H) 05/18/2021   MCV 93.3 05/18/2021   PLT 242 05/18/2021    Lab Results  Component Value Date   CREATININE 0.83 05/18/2021    No results found for: PSA  No results found for: TESTOSTERONE  No results found for: HGBA1C  Urinalysis    Component Value Date/Time   COLORURINE YELLOW 02/19/2020 1518   APPEARANCEUR Clear 04/05/2021 1405   LABSPEC <1.005 (L) 02/19/2020 1518   LABSPEC 1.003 09/19/2012 1805   PHURINE 5.0 02/19/2020 1518   GLUCOSEU Negative  04/05/2021 1405   GLUCOSEU Negative 09/19/2012 1805   HGBUR NEGATIVE 02/19/2020 1518   BILIRUBINUR Negative 04/05/2021 1405   BILIRUBINUR Negative 09/19/2012 1805   KETONESUR NEGATIVE 02/19/2020 1518   PROTEINUR Negative 04/05/2021 1405   PROTEINUR NEGATIVE 02/19/2020 1518   NITRITE Negative 04/05/2021 1405   NITRITE NEGATIVE 02/19/2020 1518   LEUKOCYTESUR Negative 04/05/2021 1405   LEUKOCYTESUR NEGATIVE 02/19/2020 1518   LEUKOCYTESUR Negative 09/19/2012 1805    Pertinent Imaging:   Assessment & Plan: Patient has refractory urgency incontinence.  We will schedule Botox from approximately 6 months from the last 1 with the usual protocol.  Ciprofloxacin prescription sent  1. Urge incontinence  - BLADDER SCAN AMB NON-IMAGING   No follow-ups on file.  09/21/2012, MD  [facility] [address] [place], [place] [phone]

## 2021-06-25 ENCOUNTER — Other Ambulatory Visit: Payer: Self-pay | Admitting: Family Medicine

## 2021-06-25 ENCOUNTER — Other Ambulatory Visit: Payer: Self-pay

## 2021-06-25 DIAGNOSIS — F17210 Nicotine dependence, cigarettes, uncomplicated: Secondary | ICD-10-CM

## 2021-06-25 DIAGNOSIS — F419 Anxiety disorder, unspecified: Secondary | ICD-10-CM

## 2021-06-25 MED ORDER — BUPROPION HCL ER (SR) 150 MG PO TB12
150.0000 mg | ORAL_TABLET | Freq: Two times a day (BID) | ORAL | 0 refills | Status: DC
  Filled 2021-06-25: qty 60, 30d supply, fill #0

## 2021-06-29 ENCOUNTER — Other Ambulatory Visit: Payer: Self-pay

## 2021-07-28 ENCOUNTER — Other Ambulatory Visit: Payer: Self-pay

## 2021-07-28 ENCOUNTER — Other Ambulatory Visit: Payer: Self-pay | Admitting: Family Medicine

## 2021-07-28 ENCOUNTER — Other Ambulatory Visit: Payer: Self-pay | Admitting: Obstetrics and Gynecology

## 2021-07-28 DIAGNOSIS — F17210 Nicotine dependence, cigarettes, uncomplicated: Secondary | ICD-10-CM

## 2021-07-28 DIAGNOSIS — F32A Depression, unspecified: Secondary | ICD-10-CM

## 2021-07-28 DIAGNOSIS — N951 Menopausal and female climacteric states: Secondary | ICD-10-CM

## 2021-07-28 MED ORDER — ESTRADIOL 1 MG PO TABS
1.0000 mg | ORAL_TABLET | Freq: Every day | ORAL | 1 refills | Status: DC
  Filled 2021-07-28: qty 90, 90d supply, fill #0
  Filled 2021-11-26: qty 90, 90d supply, fill #1

## 2021-07-29 ENCOUNTER — Other Ambulatory Visit: Payer: Self-pay

## 2021-07-29 MED ORDER — BUPROPION HCL ER (SR) 150 MG PO TB12
150.0000 mg | ORAL_TABLET | Freq: Two times a day (BID) | ORAL | 1 refills | Status: DC
  Filled 2021-07-29: qty 60, 30d supply, fill #0
  Filled 2021-09-01: qty 60, 30d supply, fill #1

## 2021-07-29 NOTE — Telephone Encounter (Signed)
Requested medication (s) are due for refill today: yes ? ?Requested medication (s) are on the active medication list: yes ? ?Last refill:  06/25/21 #60/0 ? ?Future visit scheduled: No ? ?Notes to clinic:  Unable to refill per protocol due to failed labs, no updated results. ? ? ? ?  ?Requested Prescriptions  ?Pending Prescriptions Disp Refills  ? buPROPion (WELLBUTRIN SR) 150 MG 12 hr tablet 60 tablet 0  ?  Sig: Take 1 tablet (150 mg total) by mouth 2 (two) times daily.  ?  ? Psychiatry: Antidepressants - bupropion Failed - 07/28/2021  1:39 PM  ?  ?  Failed - AST in normal range and within 360 days  ?  AST  ?Date Value Ref Range Status  ?01/24/2020 21 0 - 40 IU/L Final  ? ?SGOT(AST)  ?Date Value Ref Range Status  ?09/19/2012 92 (H) 15 - 37 Unit/L Final  ?  ?  ?  ?  Failed - ALT in normal range and within 360 days  ?  ALT  ?Date Value Ref Range Status  ?01/24/2020 20 0 - 32 IU/L Final  ? ?SGPT (ALT)  ?Date Value Ref Range Status  ?09/19/2012 74 12 - 78 U/L Final  ?  ?  ?  ?  Passed - Cr in normal range and within 360 days  ?  Creatinine  ?Date Value Ref Range Status  ?09/19/2012 0.50 (L) 0.60 - 1.30 mg/dL Final  ? ?Creatinine, Ser  ?Date Value Ref Range Status  ?05/18/2021 0.83 0.44 - 1.00 mg/dL Final  ?  ?  ?  ?  Passed - Last BP in normal range  ?  BP Readings from Last 1 Encounters:  ?06/14/21 136/85  ?  ?  ?  ?  Passed - Valid encounter within last 6 months  ?  Recent Outpatient Visits   ? ?      ? 2 months ago Herpes zoster without complication  ? [facility] Duanne Limerick, MD  ? 2 months ago Anxiety and depression  ? [facility] Duanne Limerick, MD  ? 4 months ago Anxiety and depression  ? [facility] Duanne Limerick, MD  ? 8 months ago Elevated hemoglobin (HCC)  ? [facility] Duanne Limerick, MD  ? 9 months ago Elevated hemoglobin (HCC)  ? [facility] Duanne Limerick, MD  ? ?  ?  ? ?  ?  ?  ? ?

## 2021-09-01 ENCOUNTER — Other Ambulatory Visit: Payer: Self-pay

## 2021-09-02 ENCOUNTER — Other Ambulatory Visit: Payer: Self-pay

## 2021-09-07 ENCOUNTER — Encounter: Payer: Self-pay | Admitting: Hematology and Oncology

## 2021-09-14 ENCOUNTER — Other Ambulatory Visit: Payer: Self-pay

## 2021-09-14 ENCOUNTER — Ambulatory Visit (INDEPENDENT_AMBULATORY_CARE_PROVIDER_SITE_OTHER): Payer: No Typology Code available for payment source | Admitting: Psychiatry

## 2021-09-14 ENCOUNTER — Encounter: Payer: Self-pay | Admitting: Psychiatry

## 2021-09-14 VITALS — BP 119/81 | HR 93 | Temp 97.9°F | Ht 67.25 in | Wt 192.0 lb

## 2021-09-14 DIAGNOSIS — F330 Major depressive disorder, recurrent, mild: Secondary | ICD-10-CM | POA: Diagnosis not present

## 2021-09-14 DIAGNOSIS — F1021 Alcohol dependence, in remission: Secondary | ICD-10-CM | POA: Diagnosis not present

## 2021-09-14 DIAGNOSIS — G4700 Insomnia, unspecified: Secondary | ICD-10-CM | POA: Diagnosis not present

## 2021-09-14 DIAGNOSIS — F17200 Nicotine dependence, unspecified, uncomplicated: Secondary | ICD-10-CM | POA: Diagnosis not present

## 2021-09-14 DIAGNOSIS — Z720 Tobacco use: Secondary | ICD-10-CM | POA: Insufficient documentation

## 2021-09-14 MED ORDER — BUPROPION HCL ER (XL) 300 MG PO TB24
300.0000 mg | ORAL_TABLET | Freq: Every day | ORAL | 0 refills | Status: DC
  Filled 2021-09-14: qty 90, 90d supply, fill #0

## 2021-09-14 MED ORDER — DOXEPIN HCL 10 MG PO CAPS
10.0000 mg | ORAL_CAPSULE | Freq: Every evening | ORAL | 1 refills | Status: DC | PRN
  Filled 2021-09-14: qty 30, 30d supply, fill #0
  Filled 2021-12-09: qty 30, 30d supply, fill #1

## 2021-09-14 NOTE — Patient Instructions (Signed)
Doxepin Capsules What is this medication? DOXEPIN (DOX e pin) treats depression and anxiety. It increases the amount of serotonin and norepinephrine in the brain, hormones that help regulate mood. It belongs to a group of medications called tricyclic antidepressants (TCAs). This medicine may be used for other purposes; ask your health care provider or pharmacist if you have questions. COMMON BRAND NAME(S): Sinequan What should I tell my care team before I take this medication? They need to know if you have any of these conditions: Bipolar disorder Difficulty passing urine Glaucoma Heart disease If you frequently drink alcohol containing drinks Liver disease Lung or breathing disease, like asthma or sleep apnea Prostate trouble Schizophrenia Seizures Suicidal thoughts, plans, or attempt; a previous suicide attempt by you or a family member An unusual or allergic reaction to doxepin, other medications, foods, dyes, or preservatives Pregnant or trying to get pregnant Breast-feeding How should I use this medication? Take this medication by mouth with a glass of water. Follow the directions on the prescription label. Take your doses at regular intervals. Do not take your medication more often than directed. Do not stop taking this medication suddenly except upon the advice of your care team. Stopping this medication too quickly may cause serious side effects or your condition may worsen. A special MedGuide will be given to you by the pharmacist with each prescription and refill. Be sure to read this information carefully each time. Talk to your care team about the use of this medication in children. While this medication may be prescribed for children as young as 12 years for selected conditions, precautions do apply. Overdosage: If you think you have taken too much of this medicine contact a poison control center or emergency room at once. NOTE: This medicine is only for you. Do not share this  medicine with others. What if I miss a dose? If you miss a dose, take it as soon as you can. If it is almost time for your next dose, take only that dose. Do not take double or extra doses. What may interact with this medication? Do not take this medication with any of the following: Arsenic trioxide Certain medications used to regulate abnormal heartbeat or to treat other heart conditions Cisapride Halofantrine Levomethadyl Linezolid MAOIs like Carbex, Eldepryl, Marplan, Nardil, and Parnate Methylene blue Other medications for mental depression Phenothiazines like perphenazine, thioridazine and chlorpromazine Pimozide Procarbazine Sparfloxacin St. John's Wort This medication may also interact with the following: Cimetidine Tolazamide Ziprasidone This list may not describe all possible interactions. Give your health care provider a list of all the medicines, herbs, non-prescription drugs, or dietary supplements you use. Also tell them if you smoke, drink alcohol, or use illegal drugs. Some items may interact with your medicine. What should I watch for while using this medication? Visit your care team for regular checks on your progress. It can take several days before you feel the full effect of this medication. If you have been taking this medication regularly for some time, do not suddenly stop taking it. You must gradually reduce the dose or you may get severe side effects. Ask your care team for advice. Even after you stop taking this medication it can still affect your body for several days. Patients and their families should watch out for new or worsening thoughts of suicide or depression. Also watch out for sudden changes in feelings such as feeling anxious, agitated, panicky, irritable, hostile, aggressive, impulsive, severely restless, overly excited and hyperactive, or not being able   to sleep. If this happens, especially at the beginning of treatment or after a change in dose,  call your care team. You may get drowsy or dizzy. Do not drive, use machinery, or do anything that needs mental alertness until you know how this medication affects you. Do not stand or sit up quickly, especially if you are an older patient. This reduces the risk of dizzy or fainting spells. Alcohol may increase dizziness and drowsiness. Avoid alcoholic drinks. Do not treat yourself for coughs, colds, or allergies without asking your care team for advice. Some ingredients can increase possible side effects. Your mouth may get dry. Chewing sugarless gum or sucking hard candy, and drinking plenty of water may help. Contact your care team if the problem does not go away or is severe. This medication may cause dry eyes and blurred vision. If you wear contact lenses you may feel some discomfort. Lubricating drops may help. See your care team if the problem does not go away or is severe. This medication can make you more sensitive to the sun. Keep out of the sun. If you cannot avoid being in the sun, wear protective clothing and use sunscreen. Do not use sun lamps or tanning beds/booths. What side effects may I notice from receiving this medication? Side effects that you should report to your care team as soon as possible: Allergic reactions--skin rash, itching, hives, swelling of the face, lips, tongue, or throat Irritability, confusion, fast or irregular heartbeat, muscle stiffness, twitching muscles, sweating, high fever, seizure, chills, vomiting, diarrhea, which may be signs of serotonin syndrome Sudden eye pain or change in vision such as blurry vision, seeing halos around lights, vision loss Thoughts of suicide or self-harm, worsening mood, or feelings of depression Trouble passing urine Side effects that usually do not require medical attention (report to your care team if they continue or are bothersome): Change in sex drive or performance Constipation Dizziness Drowsiness Dry  mouth Tremors Weight gain This list may not describe all possible side effects. Call your doctor for medical advice about side effects. You may report side effects to FDA at 1-800-FDA-1088. Where should I keep my medication? Keep out of the reach of children. Store at room temperature between 15 and 30 degrees C (59 and 86 degrees F). Throw away any unused medication after the expiration date. NOTE: This sheet is a summary. It may not cover all possible information. If you have questions about this medicine, talk to your doctor, pharmacist, or health care provider.  2023 Elsevier/Gold Standard (2020-08-13 00:00:00)  

## 2021-09-14 NOTE — Progress Notes (Signed)
Psychiatric Initial Adult Assessment  ? ?Patient Identification: Anna Cameron ?MRN:  [id number] ?Date of Evaluation:  09/14/2021 ?Referral Source: Otilio Miu MD ?Chief Complaint:   ?Chief Complaint  ?Patient presents with  ? Establish Care: 46 year old Caucasian female with history of depression, presented for medication management and to establish care.  ? ?Visit Diagnosis:  ?  ICD-10-CM   ?1. MDD (major depressive disorder), recurrent episode, mild (HCC)  F33.0 buPROPion (WELLBUTRIN XL) 300 MG 24 hr tablet  ?  doxepin (SINEQUAN) 10 MG capsule  ?  TSH  ?  ?2. Insomnia, unspecified type  G47.00 doxepin (SINEQUAN) 10 MG capsule  ?  TSH  ?  ?3. Tobacco use disorder  F17.200   ?  ?4. Alcohol use disorder, moderate, in sustained remission ([place])  F10.21   ?  ? ? ?History of Present Illness:  Anna Cameron is a 46 year old Caucasian female, who is employed, divorced, lives in [place], has a history of depression, fatigue, was evaluated in office today. ? ?Patient was under the care of her primary care provider since the past several months.  Patient reports having low energy, fatigue, feeling sluggish on a regular basis.  This has been getting worse since the past several months.  Patient also reports sadness some days.  Patient reports having tried medications like Prozac, was on 20 mg may have tried it for a few weeks.  She was then changed to Wellbutrin and currently takes 150 mg twice a day.  Has been on it since the past several months.  Does not believe this is beneficial at this time. ? ? ?Patient also reports sleep problems, reports she goes to bed after her work late at night.  She is able to fall asleep by around 11:30 PM.  She however has to wake up at around 5 in the morning.  She gets only around 5-1/2 to 6 hours at night.  She reports that is also interrupted, she does not know what wakes her up. She does not take any naps during the day since she has to work.  Patient is currently not on any sleep  medications however reports she may have tried melatonin in the past which did not help.  She takes a second dose of Wellbutrin at bedtime likely this could also be affecting her sleep. ? 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  Does feel anxious and overwhelmed at times.  Going on since the past several years.  However reports it does not affect her functioning. ? ?Patient denies any history of trauma. ? ?Patient does report a history of heavy use of alcohol, for several years in the past.  Patient has been sober since the past 5 to 6 years.  ? ?Patient denies any suicidality, homicidality or perceptual disturbances. ? ? ? ? ?Associated Signs/Symptoms: ?Depression Symptoms:  depressed mood, ?anhedonia, ?insomnia, ?fatigue, ?feelings of worthlessness/guilt, ?difficulty concentrating, ?loss of energy/fatigue, ?decreased appetite, ?(Hypo) Manic Symptoms:   Denies ?Anxiety Symptoms:  Excessive Worry, ?Psychotic Symptoms:   Denies ?PTSD Symptoms: ?Negative ? ?Past Psychiatric History: Patient was under the care of her primary care provider who started her on medications.  She has tried medications like Prozac, Wellbutrin.  Denies inpatient mental health admissions.  Denies suicide attempts. ? ?Previous Psychotropic Medications: Yes Prozac, Wellbutrin. ? ?Substance Abuse History in the last 12 months:  No. ? ?Consequences of Substance Abuse: ?Negative ? ?Past Medical History:  ?Past Medical History:  ?Diagnosis Date  ?  Allergy   ? Anxiety   ? Depression   ?  ?Past Surgical History:  ?Procedure Laterality Date  ? ABDOMINAL HYSTERECTOMY    ? bladder tact    ? BOTOX INJECTION    ? CESAREAN SECTION    ? x 3  ? CYSTOSCOPY WITH INJECTION N/A 09/05/2019  ? Procedure: CYSTOSCOPY WITH INJECTION BLADDER BOTOX;  Surgeon: Royston Cowper, MD;  Location: [facility] ORS;  Service: Urology;  Laterality: N/A;  ? ? ?Family Psychiatric History: Brother-schizophrenia. ? ?Family History:  ?Family History   ?Problem Relation Age of Onset  ? Cancer Father   ? Schizophrenia Brother   ? Cancer Maternal Grandmother   ? ? ?Social History:   ?Social History  ? ?Socioeconomic History  ? Marital status: Married  ?  Spouse name: Not on file  ? Number of children: 5  ? Years of education: ged  ? Highest education level: Not on file  ?Occupational History  ? Not on file  ?Tobacco Use  ? Smoking status: Every Day  ?  Packs/day: 1.50  ?  Years: 29.00  ?  Pack years: 43.50  ?  Types: Cigarettes  ? Smokeless tobacco: Never  ?Vaping Use  ? Vaping Use: Never used  ?Substance and Sexual Activity  ? Alcohol use: Not Currently  ? Drug use: Never  ? Sexual activity: Not Currently  ?Other Topics Concern  ? Not on file  ?Social History Narrative  ? Not on file  ? ?Social Determinants of Health  ? ?Financial Resource Strain: Not on file  ?Food Insecurity: Not on file  ?Transportation Needs: Not on file  ?Physical Activity: Not on file  ?Stress: Not on file  ?Social Connections: Not on file  ? ? ?Additional Social History: Patient was born in [place].  Patient has a GED. Patient has 5 children aged between 28-16.  Her 3 and 54 year old children still lives in her home.  Has a sibling-brother does not have a good relationship.  Patient was divorced in the past however currently remarried, trying to get her husband who is in [place] immigrated in the [place].  Her mother still lives in [place].  Does report a history of DWI in the past x2.  None pending at this time. ? ?Allergies:  No Known Allergies ? ?Metabolic Disorder Labs: ?No results found for: HGBA1C, MPG ?No results found for: PROLACTIN ?No results found for: CHOL, TRIG, HDL, CHOLHDL, VLDL, LDLCALC ?Lab Results  ?Component Value Date  ? TSH 2.210 01/24/2020  ? ? ?Therapeutic Level Labs: ?No results found for: LITHIUM ?No results found for: CBMZ ?No results found for: VALPROATE ? ?Current Medications: ?Current Outpatient Medications  ?Medication Sig Dispense Refill  ? buPROPion  (WELLBUTRIN XL) 300 MG 24 hr tablet Take 1 tablet (300 mg total) by mouth daily with breakfast. 90 tablet 0  ? doxepin (SINEQUAN) 10 MG capsule Take 1 capsule (10 mg total) by mouth at bedtime as needed. For sleep 30 capsule 1  ? estradiol (ESTRACE) 1 MG tablet Take 1 tablet (1 mg total) by mouth daily. 90 tablet 1  ? fexofenadine (ALLEGRA) 180 MG tablet Take 180 mg by mouth daily.    ? Multiple Vitamin (MULTIVITAMIN WITH MINERALS) TABS tablet Take 1 tablet by mouth daily.    ? Vibegron (GEMTESA) 75 MG TABS Take 75 mg by mouth daily. 30 tablet 11  ? vitamin B-12 (CYANOCOBALAMIN) 1000 MCG tablet Take 1,000 mcg by mouth daily.     ? nicotine (NICODERM CQ)  14 mg/24hr patch Place 1 patch (14 mg total) onto the skin daily. (Patient not taking: Reported on 09/14/2021) 28 patch 0  ? nicotine polacrilex (SM NICOTINE POLACRILEX) 4 MG gum Take by mouth as directed (Patient not taking: Reported on 09/14/2021) 110 each 0  ? predniSONE (DELTASONE) 10 MG tablet Take 1 tablet (10 mg total) by mouth daily with breakfast. (Patient not taking: Reported on 09/14/2021) 10 tablet 0  ? varenicline (CHANTIX) 1 MG tablet Take 1/2 tablet by mouth once daily for 3 days, then increase to 1/2 tablet twice daily for 4 days, then increase to one tablet twice daily. (Patient not taking: Reported on 09/14/2021) 56 tablet 0  ? ?No current facility-administered medications for this visit.  ? ? ?Musculoskeletal: ?Strength & Muscle Tone: within normal limits ?Gait & Station: normal ?Patient leans: N/A ? ?Psychiatric Specialty Exam: ?Review of Systems  ?Constitutional:  Positive for fatigue.  ?Psychiatric/Behavioral:  Positive for decreased concentration, dysphoric mood and sleep disturbance. The patient is nervous/anxious.   ?All other systems reviewed and are negative.  ?Blood pressure 119/81, pulse 93, temperature 97.9 ?F (36.6 ?C), height 5' 7.25" (1.708 m), weight 192 lb (87.1 kg), SpO2 98 %.Body mass index is 29.85 kg/m?.  ?General Appearance: Casual   ?Eye Contact:  Fair  ?Speech:  Clear and Coherent  ?Volume:  Normal  ?Mood:  Anxious and Depressed  ?Affect:  Congruent  ?Thought Process:  Goal Directed and Descriptions of Associations: Intact  ?Orientation:  Full (T

## 2021-09-15 ENCOUNTER — Encounter: Payer: Self-pay | Admitting: Psychiatry

## 2021-10-26 ENCOUNTER — Ambulatory Visit: Payer: No Typology Code available for payment source | Admitting: Psychiatry

## 2021-11-25 ENCOUNTER — Other Ambulatory Visit: Payer: Self-pay | Admitting: *Deleted

## 2021-11-25 DIAGNOSIS — N3941 Urge incontinence: Secondary | ICD-10-CM

## 2021-11-26 ENCOUNTER — Other Ambulatory Visit: Payer: Self-pay

## 2021-11-29 ENCOUNTER — Other Ambulatory Visit: Payer: Self-pay

## 2021-11-29 ENCOUNTER — Other Ambulatory Visit: Payer: No Typology Code available for payment source

## 2021-11-29 DIAGNOSIS — N3941 Urge incontinence: Secondary | ICD-10-CM

## 2021-11-30 LAB — URINALYSIS, COMPLETE
Bilirubin, UA: NEGATIVE
Glucose, UA: NEGATIVE
Ketones, UA: NEGATIVE
Leukocytes,UA: NEGATIVE
Nitrite, UA: NEGATIVE
Protein,UA: NEGATIVE
Specific Gravity, UA: 1.025 (ref 1.005–1.030)
Urobilinogen, Ur: 0.2 mg/dL (ref 0.2–1.0)
pH, UA: 5.5 (ref 5.0–7.5)

## 2021-11-30 LAB — MICROSCOPIC EXAMINATION

## 2021-12-02 LAB — CULTURE, URINE COMPREHENSIVE

## 2021-12-10 ENCOUNTER — Other Ambulatory Visit: Payer: Self-pay

## 2021-12-10 MED ORDER — CIPROFLOXACIN HCL 250 MG PO TABS
ORAL_TABLET | ORAL | 0 refills | Status: DC
  Filled 2021-12-10: qty 6, 3d supply, fill #0

## 2021-12-13 ENCOUNTER — Encounter: Payer: Self-pay | Admitting: Urology

## 2021-12-13 ENCOUNTER — Ambulatory Visit (INDEPENDENT_AMBULATORY_CARE_PROVIDER_SITE_OTHER): Payer: No Typology Code available for payment source | Admitting: Urology

## 2021-12-13 VITALS — BP 127/83 | HR 83 | Ht 67.0 in | Wt 192.0 lb

## 2021-12-13 DIAGNOSIS — N3946 Mixed incontinence: Secondary | ICD-10-CM | POA: Diagnosis not present

## 2021-12-13 MED ORDER — ONABOTULINUMTOXINA 100 UNITS IJ SOLR
100.0000 [IU] | Freq: Once | INTRAMUSCULAR | Status: AC
  Administered 2021-12-13: 100 [IU] via INTRAMUSCULAR

## 2021-12-13 NOTE — Progress Notes (Signed)
12/13/2021 8:27 AM   Anna Cameron Allegra Grana [birth year] [id number]  Referring provider: Duanne Limerick, MD [address] [place],  [place] [address]  No chief complaint on file.   HPI: I reviewed chart.  Patient has refractory urge incontinence and mild stress incontinence.  She has had a number of Botox treatments.  Her last Botox treatment was in November 2022.  She is on [place] Botox treatment worked very well.  She is a nurse here in the hospital.  Having said that she still felt some urgency so is doing beautifully on [place].  She still wants regular treatments.  Clinically not infected frequency stable  Patient has refractory urgency incontinence.  We will schedule Botox from approximately 6 months from the last 1 with the usual protocol.  Ciprofloxacin prescription sent  Cystoscopy and Botox: Patient underwent cystoscopy.  Bladder mucosa and trigone were normal.  Clinically not infected.  I injected Botox with the usual template a little bit more central but also lower third of bladder 10 cc 100 units of Botox.  1 cc/inj.  Very well-tolerated.       PMH: Past Medical History:  Diagnosis Date   Allergy    Anxiety    Depression     Surgical History: Past Surgical History:  Procedure Laterality Date   ABDOMINAL HYSTERECTOMY     bladder tact     BOTOX INJECTION     CESAREAN SECTION     x 3   CYSTOSCOPY WITH INJECTION N/A 09/05/2019   Procedure: CYSTOSCOPY WITH INJECTION BLADDER BOTOX;  Surgeon: Orson Ape, MD;  Location: [facility] ORS;  Service: Urology;  Laterality: N/A;    Home Medications:  Allergies as of 12/13/2021   No Known Allergies      Medication List        Accurate as of December 13, 2021  8:27 AM. If you have any questions, ask your nurse or doctor.          buPROPion 300 MG 24 hr tablet Commonly known as: Wellbutrin XL Take 1 tablet (300 mg total) by mouth daily with breakfast.   ciprofloxacin 250 MG tablet Commonly known as:  Cipro Take 2 tabs the day before procedure, take 2 tabs day of procedure, and take 2 tabs day after procedure.   doxepin 10 MG capsule Commonly known as: SINEQUAN Take 1 capsule (10 mg total) by mouth at bedtime as needed for sleep (Take 1 capsule (10 mg total) by mouth at bedtime as needed. For sleep)   estradiol 1 MG tablet Commonly known as: ESTRACE Take 1 tablet (1 mg total) by mouth daily.   fexofenadine 180 MG tablet Commonly known as: ALLEGRA Take 180 mg by mouth daily.   Gemtesa 75 MG Tabs Generic drug: Vibegron Take 1 tablet by mouth daily (Take 75 mg by mouth daily.)   multivitamin with minerals Tabs tablet Take 1 tablet by mouth daily.   nicotine 14 mg/24hr patch Commonly known as: Nicoderm CQ Place 1 patch (14 mg total) onto the skin daily.   predniSONE 10 MG tablet Commonly known as: DELTASONE Take 1 tablet (10 mg total) by mouth daily with breakfast.   SM Nicotine Polacrilex 4 MG gum Generic drug: nicotine polacrilex Take by mouth as directed   varenicline 1 MG tablet Commonly known as: CHANTIX Take 1/2 tablet by mouth once daily for 3 days, then increase to 1/2 tablet twice daily for 4 days, then increase to one tablet twice  daily. (Take 1/2 tablet by mouth once daily for 3 days, then increase to 1/2 tablet twice daily for 4 days, then increase to one tablet twice daily.)   vitamin B-12 1000 MCG tablet Commonly known as: CYANOCOBALAMIN Take 1,000 mcg by mouth daily.        Allergies: No Known Allergies  Family History: Family History  Problem Relation Age of Onset   Cancer Father    Schizophrenia Brother    Cancer Maternal Grandmother     Social History:  reports that she has been smoking cigarettes. She has a 43.50 pack-year smoking history. She has never used smokeless tobacco. She reports that she does not currently use alcohol. She reports that she does not use drugs.  ROS:                                         Physical Exam: There were no vitals taken for this visit.  Constitutional:  Alert and oriented, No acute distress.   Laboratory Data: Lab Results  Component Value Date   WBC 6.2 05/18/2021   HGB 16.9 (H) 05/18/2021   HCT 50.3 (H) 05/18/2021   MCV 93.3 05/18/2021   PLT 242 05/18/2021    Lab Results  Component Value Date   CREATININE 0.83 05/18/2021    No results found for: "PSA"  No results found for: "TESTOSTERONE"  No results found for: "HGBA1C"  Urinalysis    Component Value Date/Time   COLORURINE YELLOW 02/19/2020 1518   APPEARANCEUR Clear 11/29/2021 0907   LABSPEC <1.005 (L) 02/19/2020 1518   LABSPEC 1.003 09/19/2012 1805   PHURINE 5.0 02/19/2020 1518   GLUCOSEU Negative 11/29/2021 0907   GLUCOSEU Negative 09/19/2012 1805   HGBUR NEGATIVE 02/19/2020 1518   BILIRUBINUR Negative 11/29/2021 0907   BILIRUBINUR Negative 09/19/2012 1805   KETONESUR NEGATIVE 02/19/2020 1518   PROTEINUR Negative 11/29/2021 0907   PROTEINUR NEGATIVE 02/19/2020 1518   NITRITE Negative 11/29/2021 0907   NITRITE NEGATIVE 02/19/2020 1518   LEUKOCYTESUR Negative 11/29/2021 0907   LEUKOCYTESUR NEGATIVE 02/19/2020 1518   LEUKOCYTESUR Negative 09/19/2012 1805    Pertinent Imaging:   Assessment & Plan: Patient did very well with Botox especially if she takes [place].  She thinks it lasted a lot longer than 6 months.  Gemtesa renewed.  She likely will make a office appointment in 6 months as opposed to scheduling more Botox.  She has been doing very well.  Follow-up as per protocol  There are no diagnoses linked to this encounter.  No follow-ups on file.  Martina Sinner, MD  [facility] [address], [address] [place], [place] [address] [phone]

## 2022-03-23 ENCOUNTER — Other Ambulatory Visit: Payer: Self-pay | Admitting: Obstetrics and Gynecology

## 2022-03-23 ENCOUNTER — Other Ambulatory Visit: Payer: Self-pay | Admitting: Psychiatry

## 2022-03-23 ENCOUNTER — Other Ambulatory Visit (HOSPITAL_COMMUNITY): Payer: Self-pay

## 2022-03-23 DIAGNOSIS — F330 Major depressive disorder, recurrent, mild: Secondary | ICD-10-CM

## 2022-03-23 DIAGNOSIS — N951 Menopausal and female climacteric states: Secondary | ICD-10-CM

## 2022-03-23 MED ORDER — ESTRADIOL 1 MG PO TABS
1.0000 mg | ORAL_TABLET | Freq: Every day | ORAL | 0 refills | Status: DC
  Filled 2022-03-23: qty 90, 90d supply, fill #0

## 2022-03-23 NOTE — Telephone Encounter (Signed)
One additional refill until patient is seen for annual.

## 2022-03-24 ENCOUNTER — Other Ambulatory Visit (HOSPITAL_COMMUNITY): Payer: Self-pay

## 2022-03-28 ENCOUNTER — Other Ambulatory Visit (HOSPITAL_COMMUNITY): Payer: Self-pay

## 2022-04-06 ENCOUNTER — Other Ambulatory Visit (HOSPITAL_COMMUNITY): Payer: Self-pay

## 2022-05-02 ENCOUNTER — Encounter: Payer: Self-pay | Admitting: Hematology and Oncology

## 2022-05-07 ENCOUNTER — Encounter: Payer: Self-pay | Admitting: Hematology and Oncology

## 2022-06-06 ENCOUNTER — Encounter: Payer: Self-pay | Admitting: Urology

## 2022-06-06 ENCOUNTER — Ambulatory Visit: Payer: No Typology Code available for payment source | Admitting: Urology

## 2022-06-30 ENCOUNTER — Other Ambulatory Visit: Payer: Self-pay | Admitting: Urology

## 2022-06-30 ENCOUNTER — Encounter: Payer: Self-pay | Admitting: Hematology and Oncology

## 2022-06-30 ENCOUNTER — Other Ambulatory Visit (HOSPITAL_COMMUNITY): Payer: Self-pay

## 2022-06-30 DIAGNOSIS — N3941 Urge incontinence: Secondary | ICD-10-CM

## 2022-06-30 MED ORDER — GEMTESA 75 MG PO TABS
75.0000 mg | ORAL_TABLET | Freq: Every day | ORAL | 1 refills | Status: DC
  Filled 2022-06-30 – 2022-07-18 (×3): qty 30, 30d supply, fill #0
  Filled 2022-09-02: qty 30, 30d supply, fill #1

## 2022-07-01 ENCOUNTER — Other Ambulatory Visit (HOSPITAL_COMMUNITY): Payer: Self-pay

## 2022-07-05 ENCOUNTER — Other Ambulatory Visit (HOSPITAL_COMMUNITY): Payer: Self-pay

## 2022-07-06 ENCOUNTER — Inpatient Hospital Stay (HOSPITAL_COMMUNITY)
Admission: EM | Admit: 2022-07-06 | Discharge: 2022-07-08 | DRG: 322 | Disposition: A | Discharge status: Home / Self Care | Payer: 59 | Attending: Internal Medicine | Admitting: Internal Medicine

## 2022-07-06 ENCOUNTER — Encounter (HOSPITAL_COMMUNITY): Payer: Self-pay | Admitting: *Deleted

## 2022-07-06 ENCOUNTER — Emergency Department (HOSPITAL_COMMUNITY): Payer: 59

## 2022-07-06 ENCOUNTER — Ambulatory Visit: Payer: Self-pay | Admitting: *Deleted

## 2022-07-06 ENCOUNTER — Other Ambulatory Visit: Payer: Self-pay

## 2022-07-06 DIAGNOSIS — E876 Hypokalemia: Secondary | ICD-10-CM | POA: Diagnosis present

## 2022-07-06 DIAGNOSIS — R030 Elevated blood-pressure reading, without diagnosis of hypertension: Secondary | ICD-10-CM | POA: Diagnosis not present

## 2022-07-06 DIAGNOSIS — E785 Hyperlipidemia, unspecified: Secondary | ICD-10-CM | POA: Insufficient documentation

## 2022-07-06 DIAGNOSIS — I2510 Atherosclerotic heart disease of native coronary artery without angina pectoris: Secondary | ICD-10-CM | POA: Diagnosis not present

## 2022-07-06 DIAGNOSIS — D751 Secondary polycythemia: Secondary | ICD-10-CM | POA: Diagnosis not present

## 2022-07-06 DIAGNOSIS — R0789 Other chest pain: Secondary | ICD-10-CM | POA: Diagnosis not present

## 2022-07-06 DIAGNOSIS — Z79899 Other long term (current) drug therapy: Secondary | ICD-10-CM

## 2022-07-06 DIAGNOSIS — Z955 Presence of coronary angioplasty implant and graft: Secondary | ICD-10-CM

## 2022-07-06 DIAGNOSIS — F17210 Nicotine dependence, cigarettes, uncomplicated: Secondary | ICD-10-CM | POA: Diagnosis present

## 2022-07-06 DIAGNOSIS — Z90710 Acquired absence of both cervix and uterus: Secondary | ICD-10-CM

## 2022-07-06 DIAGNOSIS — I252 Old myocardial infarction: Secondary | ICD-10-CM | POA: Diagnosis not present

## 2022-07-06 DIAGNOSIS — I214 Non-ST elevation (NSTEMI) myocardial infarction: Principal | ICD-10-CM | POA: Diagnosis present

## 2022-07-06 DIAGNOSIS — Z818 Family history of other mental and behavioral disorders: Secondary | ICD-10-CM

## 2022-07-06 DIAGNOSIS — F419 Anxiety disorder, unspecified: Secondary | ICD-10-CM | POA: Diagnosis not present

## 2022-07-06 DIAGNOSIS — F32A Depression, unspecified: Secondary | ICD-10-CM | POA: Diagnosis present

## 2022-07-06 DIAGNOSIS — Z720 Tobacco use: Secondary | ICD-10-CM | POA: Diagnosis present

## 2022-07-06 HISTORY — DX: Tobacco use: Z72.0

## 2022-07-06 HISTORY — DX: Secondary polycythemia: D75.1

## 2022-07-06 LAB — CBC
HCT: 47.2 % — ABNORMAL HIGH (ref 36.0–46.0)
Hemoglobin: 15.8 g/dL — ABNORMAL HIGH (ref 12.0–15.0)
MCH: 31.9 pg (ref 26.0–34.0)
MCHC: 33.5 g/dL (ref 30.0–36.0)
MCV: 95.2 fL (ref 80.0–100.0)
Platelets: 301 10*3/uL (ref 150–400)
RBC: 4.96 MIL/uL (ref 3.87–5.11)
RDW: 12.6 % (ref 11.5–15.5)
WBC: 8.9 10*3/uL (ref 4.0–10.5)
nRBC: 0 % (ref 0.0–0.2)

## 2022-07-06 LAB — LIPID PANEL
Cholesterol: 258 mg/dL — ABNORMAL HIGH (ref 0–200)
HDL: 41 mg/dL (ref >40)
LDL Cholesterol: 180 mg/dL — ABNORMAL HIGH (ref 0–99)
Total CHOL/HDL Ratio: 6.3 RATIO
Triglycerides: 184 mg/dL — ABNORMAL HIGH (ref <150)
VLDL: 37 mg/dL (ref 0–40)

## 2022-07-06 LAB — BASIC METABOLIC PANEL
Anion gap: 10 (ref 5–15)
BUN: 17 mg/dL (ref 6–20)
CO2: 25 mmol/L (ref 22–32)
Calcium: 8.6 mg/dL — ABNORMAL LOW (ref 8.9–10.3)
Chloride: 108 mmol/L (ref 98–111)
Creatinine, Ser: 0.8 mg/dL (ref 0.44–1.00)
GFR, Estimated: >60 mL/min (ref >60)
Glucose, Bld: 103 mg/dL — ABNORMAL HIGH (ref 70–99)
Potassium: 3.6 mmol/L (ref 3.5–5.1)
Sodium: 143 mmol/L (ref 135–145)

## 2022-07-06 LAB — HEMOGLOBIN A1C
Hgb A1c MFr Bld: 5.6 % (ref 4.8–5.6)
Mean Plasma Glucose: 114.02 mg/dL

## 2022-07-06 LAB — PROTIME-INR
INR: 0.9 (ref 0.8–1.2)
Prothrombin Time: 12.2 seconds (ref 11.4–15.2)

## 2022-07-06 LAB — I-STAT BETA HCG BLOOD, ED (MC, WL, AP ONLY): I-stat hCG, quantitative: <5 m[IU]/mL (ref <5)

## 2022-07-06 LAB — HEPARIN LEVEL (UNFRACTIONATED): Heparin Unfractionated: <0.1 IU/mL — ABNORMAL LOW (ref 0.30–0.70)

## 2022-07-06 LAB — APTT: aPTT: 29 seconds (ref 24–36)

## 2022-07-06 LAB — TROPONIN I (HIGH SENSITIVITY)
Troponin I (High Sensitivity): 401 ng/L (ref <18)
Troponin I (High Sensitivity): 351 ng/L (ref <18)

## 2022-07-06 MED ORDER — METOPROLOL TARTRATE 12.5 MG HALF TABLET
12.5000 mg | ORAL_TABLET | Freq: Two times a day (BID) | ORAL | Status: DC
  Administered 2022-07-06 – 2022-07-08 (×4): 12.5 mg via ORAL
  Filled 2022-07-06 (×4): qty 1

## 2022-07-06 MED ORDER — HEPARIN (PORCINE) 25000 UT/250ML-% IV SOLN
1500.0000 [IU]/h | INTRAVENOUS | Status: DC
  Administered 2022-07-06: 950 [IU]/h via INTRAVENOUS
  Administered 2022-07-07: 1400 [IU]/h via INTRAVENOUS
  Filled 2022-07-06 (×2): qty 250

## 2022-07-06 MED ORDER — ONDANSETRON HCL 4 MG/2ML IJ SOLN: 4.0000 mg | Freq: Four times a day (QID) | INTRAMUSCULAR | Status: DC | PRN

## 2022-07-06 MED ORDER — NITROGLYCERIN 0.4 MG SL SUBL
0.4000 mg | SUBLINGUAL_TABLET | SUBLINGUAL | Status: DC | PRN
  Administered 2022-07-06: 0.4 mg via SUBLINGUAL
  Filled 2022-07-06: qty 1

## 2022-07-06 MED ORDER — ACETAMINOPHEN 325 MG PO TABS
650.0000 mg | ORAL_TABLET | ORAL | Status: DC | PRN
  Administered 2022-07-06: 650 mg via ORAL
  Filled 2022-07-06: qty 2

## 2022-07-06 MED ORDER — ASPIRIN 81 MG PO TBEC
81.0000 mg | DELAYED_RELEASE_TABLET | Freq: Every day | ORAL | Status: DC
  Filled 2022-07-06: qty 1

## 2022-07-06 MED ORDER — HEPARIN BOLUS VIA INFUSION
4000.0000 [IU] | Freq: Once | INTRAVENOUS | Status: AC
  Administered 2022-07-06: 4000 [IU] via INTRAVENOUS
  Filled 2022-07-06: qty 4000

## 2022-07-06 MED ORDER — ATORVASTATIN CALCIUM 80 MG PO TABS
80.0000 mg | ORAL_TABLET | Freq: Every day | ORAL | Status: DC
  Administered 2022-07-06 – 2022-07-07 (×2): 80 mg via ORAL
  Filled 2022-07-06 (×2): qty 1

## 2022-07-06 MED ORDER — ASPIRIN 81 MG PO CHEW
324.0000 mg | CHEWABLE_TABLET | Freq: Once | ORAL | Status: AC
  Administered 2022-07-06: 324 mg via ORAL
  Filled 2022-07-06: qty 4

## 2022-07-06 MED ORDER — SODIUM CHLORIDE 0.9 % IV SOLN: INTRAVENOUS | Status: DC

## 2022-07-06 NOTE — Telephone Encounter (Addendum)
  Chief Complaint: chest pain that radiates into her upper back between her shoulder blades and down her left shoulder and up both sides of her neck.   Symptoms: Pain between her breasts along with the above Frequency: For the last 2 days but getting worse Pertinent Negatives: Patient denies N/A Disposition: [x] ED /[] Urgent Care (no appt availability in office) / [] Appointment(In office/virtual)/ []  [place] [facility]/ [] Home Care/ [] Refused Recommended Disposition /[] [place] [facility]/ []  Follow-up with PCP Additional Notes: you work at [employer] [facility] [employer] [facility] [employer] so you are going to the ED there now.    I advised her not to take the stairs to ride the elevator because she mentioned going up to the ED via the stairs.   Pt. Was agreeable to going to the ED.   I called into [facility] and spoke with Estill Bamberg.   She's getting my notes to the clinical team.

## 2022-07-06 NOTE — ED Provider Triage Note (Signed)
Emergency Medicine Provider Triage Evaluation Note  Anna Cameron , a 47 y.o. female  was evaluated in triage.  Pt complains of chest pain.  Patient describes having central chest tightness that radiates to her back and left shoulder intermittently over the past 2 days.  She states that the pain is worse at night.  She has had associated nausea and diaphoresis with this pain.  She states that she has felt short of breath intermittently over the past 2 days without palpitations or lower extremity swelling.  No cardiac medical history.  No cough or fever.  Review of Systems  Positive: See above Negative:   Physical Exam  BP (!) 141/84 (BP Location: Right Arm)   Pulse 95   Temp 98 F (36.7 C) (Oral)   Resp 20   Ht 5' 7"$  (1.702 m)   Wt 86.2 kg   SpO2 100%   BMI 29.76 kg/m  Gen:   Awake, no distress   Resp:  Normal effort, lungs clear bilaterally MSK:   Moves extremities without difficulty  Other:  S1/S2 without murmur, no lower extremity edema  Medical Decision Making  Medically screening exam initiated at 12:30 PM.  Appropriate orders placed.  Anna Cameron was informed that the remainder of the evaluation will be completed by another provider, this initial triage assessment does not replace that evaluation, and the importance of remaining in the ED until their evaluation is complete.     Anna Hillier, PA-C 07/06/22 1234

## 2022-07-06 NOTE — Progress Notes (Signed)
ANTICOAGULATION CONSULT NOTE - Initial Consult  Pharmacy Consult for Heparin Indication: chest pain/ACS  No Known Allergies  Patient Measurements: Height: 5' 7"$  (170.2 cm) Weight: 86.2 kg (190 lb) IBW/kg (Calculated) : 61.6 Heparin Dosing Weight: 79.8 kg  Vital Signs: Temp: 98 F (36.7 C) (02/14 1216) Temp Source: Oral (02/14 1216) BP: 129/83 (02/14 1415) Pulse Rate: 88 (02/14 1415)  Labs: Recent Labs    07/06/22 1217  HGB 15.8*  HCT 47.2*  PLT 301  CREATININE 0.80  TROPONINIHS 401*    Estimated Creatinine Clearance: 99 mL/min (by C-G formula based on SCr of 0.8 mg/dL).   Medical History: Past Medical History:  Diagnosis Date   Allergy    Anxiety    Depression     Medications:  Scheduled:   heparin  4,000 Units Intravenous Once   Infusions:   sodium chloride 20 mL/hr at 07/06/22 1351   heparin     PRN: nitroGLYCERIN  Assessment: 47 yo female presents with chest pain, troponin elevated.  Pharmacy consulted to dose IV heparin.  Today, 07/06/2022 Baseline aPTT, PT/INR WNL CBC and SCr WNL No anticoagulants noted PTA  Goal of Therapy:  Heparin level 0.3-0.7 units/ml Monitor platelets by anticoagulation protocol: Yes   Plan:  Give 4000 units bolus x 1 Start heparin infusion at 950 units/hr Check anti-Xa level in 6 hours and daily while on heparin Continue to monitor H&H and platelets  Peggyann Juba, PharmD, BCPS Pharmacy: [phone] 07/06/2022,2:24 PM

## 2022-07-06 NOTE — H&P (Signed)
Cardiology Admission History and Physical   Patient ID: Anna Cameron MRN: [id number]; DOB: 1976/04/15   Admission date: 07/06/2022  PCP:  Juline Patch, MD   [facility] Providers Cardiologist:  New Click here to update MD or APP on Care Team, Refresh:1}     Chief Complaint:  chest pain  Patient Profile:   Anna Cameron is a 47 y.o. female with depression, anxiety, stress incontinence, tobacco abuse, sobriety from prior ETOH use, erythrocytosis (previously tx with phlebotomy) who is being seen 07/06/2022 for the evaluation of NSTEMI.  History of Present Illness:   Anna Cameron has no prior cardiac history. She has had intermittent episodes of chest heaviness for the past 3 days. Episodes have not been exertional, initially lasting for 10 minutes and progressively getting longer. Last night while lying down the pain was very severe, radiating to her back and left arm associated with diaphorses. She continued to have mid sternal heaviness this morning so presented to [facility] ER for evaluation. EKG showed NSR with non specific ST changes. Labs notable for hsTroponin 401->351, Hgb 15.8, LDL 180, trig 184, hCG neg, glucose 103. CXR shows NAD. She is not tachycardic, tachypneic or hypoxic. HR 80s-90s. She received 340m ASA, SL NTG, and was started on heparin infusion prior to arrival. She is now chest pain free.  Patient smokes 1 1/2 pack of cigarettes per day since the past 16 years. Denies family history relevant for cardiac disease.  No prior heart cath, no allergies to contrast, no active bleeding issues, no planned surgeries in the next 6 months.  Past Medical History:  Diagnosis Date   Allergy    Anxiety    Depression    [name]    Tobacco abuse     Past Surgical History:  Procedure Laterality Date   ABDOMINAL HYSTERECTOMY     bladder tact     BOTOX INJECTION     CESAREAN SECTION     x 3   CYSTOSCOPY WITH INJECTION N/A 09/05/2019   Procedure: CYSTOSCOPY  WITH INJECTION BLADDER BOTOX;  Surgeon: WRoyston Cowper MD;  Location: [facility] ORS;  Service: Urology;  Laterality: N/A;     Medications Prior to Admission: Prior to Admission medications   Medication Sig Start Date End Date Taking? Authorizing Provider  buPROPion (WELLBUTRIN XL) 300 MG 24 hr tablet Take 1 tablet (300 mg total) by mouth daily with breakfast. 09/14/21   EUrsula Alert MD  ciprofloxacin (CIPRO) 250 MG tablet Take 2 tabs the day before procedure, take 2 tabs day of procedure, and take 2 tabs day after procedure. 12/10/21   MBjorn Loser MD  doxepin (SINEQUAN) 10 MG capsule Take 1 capsule (10 mg total) by mouth at bedtime as needed. For sleep 09/14/21   EUrsula Alert MD  estradiol (ESTRACE) 1 MG tablet Take 1 tablet (1 mg total) by mouth daily. 03/23/22   EHarlin Heys MD  fexofenadine (ALLEGRA) 180 MG tablet Take 180 mg by mouth daily.    [provider]  Multiple Vitamin (MULTIVITAMIN WITH MINERALS) TABS tablet Take 1 tablet by mouth daily.    [provider]  Vibegron (GEMTESA) 75 MG TABS Take 1 tablet (75 mg total) by mouth daily. 06/30/22   MBjorn Loser MD  vitamin B-12 (CYANOCOBALAMIN) 1000 MCG tablet Take 1,000 mcg by mouth daily.     [provider]     Allergies:   No Known Allergies  Social History:   Social History   Socioeconomic  History   Marital status: Married    Spouse name: Not on file   Number of children: 5   Years of education: ged   Highest education level: Not on file  Occupational History   Not on file  Tobacco Use   Smoking status: Every Day    Packs/day: 1.50    Years: 29.00    Total pack years: 43.50    Types: Cigarettes   Smokeless tobacco: Never  Vaping Use   Vaping Use: Never used  Substance and Sexual Activity   Alcohol use: Not Currently   Drug use: Never   Sexual activity: Not Currently  Other Topics Concern   Not on file  Social History Narrative   Not on file   Social Determinants  of Health   Financial Resource Strain: Not on file  Food Insecurity: Not on file  Transportation Needs: Not on file  Physical Activity: Not on file  Stress: Not on file  Social Connections: Not on file  Intimate Partner Violence: Not on file    Family History:   The patient's family history includes Cancer in her father and maternal grandmother; Schizophrenia in her brother.    ROS:  Please see the history of present illness.  All other ROS reviewed and negative.     Physical Exam/Data:   Vitals:   07/06/22 1700 07/06/22 1730 07/06/22 1830 07/06/22 2019  BP: 131/74 127/85 133/88 (!) 144/95  Pulse: 86 81 83 78  Resp: 14 (!) 21 20   Temp:    98.2 F (36.8 C)  TempSrc:    Oral  SpO2: 96% 95% 96% 96%  Weight:    89.2 kg  Height:    5' 7"$  (1.702 m)    Intake/Output Summary (Last 24 hours) at 07/06/2022 2114 Last data filed at 07/06/2022 1814 Gross per 24 hour  Intake 70.12 ml  Output --  Net 70.12 ml      07/06/2022    8:19 PM 07/06/2022   12:13 PM 12/13/2021    8:36 AM  Last 3 Weights  Weight (lbs) 196 lb 10.4 oz 190 lb 192 lb  Weight (kg) 89.2 kg 86.183 kg 87.091 kg     Body mass index is 30.8 kg/m.  General: Well developed, well nourished, in no acute distress. Head: Normocephalic, atraumatic, sclera non-icteric, no xanthomas, nares are without discharge. Neck: Negative for carotid bruits. JVP not elevated. Lungs: Clear bilaterally to auscultation without wheezes, rales, or rhonchi. Breathing is unlabored. Heart: RRR S1 S2 without murmurs, rubs, or gallops.  Abdomen: Soft, non-tender, non-distended with normoactive bowel sounds. No rebound/guarding. Extremities: No clubbing or cyanosis. No edema. Distal pedal pulses are 2+ and equal bilaterally. Neuro: Alert and oriented X 3. Moves all extremities spontaneously. Psych:  Responds to questions appropriately with a normal affect.    EKG:  The ECG that was done today was personally reviewed and demonstrates NSR  91bpm, baseline wander, no apparent acute STT changes  Relevant CV Studies: N/A  Laboratory Data:  High Sensitivity Troponin:   Recent Labs  Lab 07/06/22 1217 07/06/22 1355  TROPONINIHS 401* 351*      Chemistry Recent Labs  Lab 07/06/22 1217  NA 143  K 3.6  CL 108  CO2 25  GLUCOSE 103*  BUN 17  CREATININE 0.80  CALCIUM 8.6*  GFRNONAA >60  ANIONGAP 10    No results for input(s): "PROT", "ALBUMIN", "AST", "ALT", "ALKPHOS", "BILITOT" in the last 168 hours. Lipids  Recent Labs  Lab 07/06/22  1355  CHOL 258*  TRIG 184*  HDL 41  LDLCALC 180*  CHOLHDL 6.3   Hematology Recent Labs  Lab 07/06/22 1217  WBC 8.9  RBC 4.96  HGB 15.8*  HCT 47.2*  MCV 95.2  MCH 31.9  MCHC 33.5  RDW 12.6  PLT 301   Thyroid No results for input(s): "TSH", "FREET4" in the last 168 hours. BNPNo results for input(s): "BNP", "PROBNP" in the last 168 hours.  DDimer No results for input(s): "DDIMER" in the last 168 hours.   Radiology/Studies:  DG Chest 2 View  Result Date: 07/06/2022 CLINICAL DATA:  Chest pressure. EXAM: CHEST - 2 VIEW COMPARISON:  02/19/2020 FINDINGS: The lungs are clear without focal pneumonia, edema, pneumothorax or pleural effusion. Cardiopericardial silhouette is at upper limits of normal for size. Stable opacity right cardiophrenic angle compatible with fat pad seen on CT of 05/11/2005. The visualized bony structures of the thorax are unremarkable. IMPRESSION: No active cardiopulmonary disease. Electronically Signed   By: Misty Stanley M.D.   On: 07/06/2022 12:29     Assessment and Plan:   1. NSTEMI - TIMI score of 4 accounting for risk factors, ASA use in the past 7 days, > 2 episodes of angina in the past 24 hrs and +ve biomarkers - Echocardiogram ordered - ASA, BB, high intensity statin - Heparin drip - NPO after midnight - Cath tomorrow  2. Hyperlipidemia - LDL of 180. No clinical features of FH - Add high intensity statin  3. Elevated hemoglobin  level/erythrocytosis - notes from heme-onc 2021 outline no evidence of myeloproliferative disorder, trial of phlebotomy which patient did not sustain per PCP f/u notes in 2022 after starting plasma donation - f/u PCP as OP   Risk Assessment/Risk Scores:    TIMI Risk Score for Unstable Angina or Non-ST Elevation MI:   The patient's TIMI risk score is  , which indicates a  % risk of all cause mortality, new or recurrent myocardial infarction or need for urgent revascularization in the next 14 days.     Severity of Illness: The appropriate patient status for this patient is INPATIENT. Inpatient status is judged to be reasonable and necessary in order to provide the required intensity of service to ensure the patient's safety. The patient's presenting symptoms, physical exam findings, and initial radiographic and laboratory data in the context of their chronic comorbidities is felt to place them at high risk for further clinical deterioration. Furthermore, it is not anticipated that the patient will be medically stable for discharge from the hospital within 2 midnights of admission.   * I certify that at the point of admission it is my clinical judgment that the patient will require inpatient hospital care spanning beyond 2 midnights from the point of admission due to high intensity of service, high risk for further deterioration and high frequency of surveillance required.*   For questions or updates, please contact [facility] Please consult [email or web address] for contact info under     Signed, Ewing Schlein, MD  07/06/2022 9:14 PM

## 2022-07-06 NOTE — Progress Notes (Signed)
ANTICOAGULATION CONSULT NOTE Pharmacy Consult for Heparin Indication: chest pain/ACS  No Known Allergies  Patient Measurements: Height: 5' 7"$  (170.2 cm) Weight: 89.2 kg (196 lb 10.4 oz) IBW/kg (Calculated) : 61.6 Heparin Dosing Weight: 79.8 kg  Vital Signs: Temp: 98.2 F (36.8 C) (02/14 2019) Temp Source: Oral (02/14 2019) BP: 144/95 (02/14 2019) Pulse Rate: 78 (02/14 2019)  Labs: Recent Labs    07/06/22 1217 07/06/22 1355 07/06/22 2048  HGB 15.8*  --   --   HCT 47.2*  --   --   PLT 301  --   --   APTT  --  29  --   LABPROT  --  12.2  --   INR  --  0.9  --   HEPARINUNFRC  --   --  <0.10*  CREATININE 0.80  --   --   TROPONINIHS 401* 351*  --      Estimated Creatinine Clearance: 100.7 mL/min (by C-G formula based on SCr of 0.8 mg/dL).   Medical History: Past Medical History:  Diagnosis Date   Allergy    Anxiety    Depression    [name]    Tobacco abuse     Medications:  Scheduled:   [START ON 07/07/2022] aspirin EC  81 mg Oral Daily   atorvastatin  80 mg Oral QHS   metoprolol tartrate  12.5 mg Oral BID   Infusions:   sodium chloride 20 mL/hr at 07/06/22 1351   heparin 950 Units/hr (07/06/22 1814)   PRN: acetaminophen, nitroGLYCERIN, ondansetron (ZOFRAN) IV  Assessment: 47 yo female presents with chest pain, troponin elevated.  Pharmacy consulted to dose IV heparin.  Heparin level < 0.10   Goal of Therapy:  Heparin level 0.3-0.7 units/ml Monitor platelets by anticoagulation protocol: Yes   Plan:  Increase heparin to 1150 units / hr Follow up AM heparin level or after cath  Thank you Anette Guarneri, PharmD 07/06/2022,9:42 PM

## 2022-07-06 NOTE — ED Notes (Signed)
Call to [facility] done

## 2022-07-06 NOTE — ED Provider Notes (Signed)
[facility] Provider Note   CSN: [id number] Arrival date & time: 07/06/22  1209     History  Chief Complaint  Patient presents with   Chest Pain    Anna Cameron is a 47 y.o. female.  HPI   47 year old female with no significant cardiac history presents emergency department with chest heaviness.  Patient states that she has had chest heaviness persisted for the past 3 days.  She presents today because last night while lying down the pain was very severe, radiating to her back and left arm.  Currently she is complaining of mild midsternal heaviness.  Denies any shortness of breath.  No swelling of her lower extremities.  No history of DVT/PE.  No recent fever or illness.  Besides pain worsening when she was laying down last night it is not positional.  Does not radiate down into her abdomen.  Home Medications Prior to Admission medications   Medication Sig Start Date End Date Taking? Authorizing Provider  buPROPion (WELLBUTRIN XL) 300 MG 24 hr tablet Take 1 tablet (300 mg total) by mouth daily with breakfast. 09/14/21   Ursula Alert, MD  ciprofloxacin (CIPRO) 250 MG tablet Take 2 tabs the day before procedure, take 2 tabs day of procedure, and take 2 tabs day after procedure. 12/10/21   Bjorn Loser, MD  doxepin (SINEQUAN) 10 MG capsule Take 1 capsule (10 mg total) by mouth at bedtime as needed. For sleep 09/14/21   Ursula Alert, MD  estradiol (ESTRACE) 1 MG tablet Take 1 tablet (1 mg total) by mouth daily. 03/23/22   Harlin Heys, MD  fexofenadine (ALLEGRA) 180 MG tablet Take 180 mg by mouth daily.    [provider]  Multiple Vitamin (MULTIVITAMIN WITH MINERALS) TABS tablet Take 1 tablet by mouth daily.    [provider]  Vibegron (GEMTESA) 75 MG TABS Take 1 tablet (75 mg total) by mouth daily. 06/30/22   Bjorn Loser, MD  vitamin B-12 (CYANOCOBALAMIN) 1000 MCG tablet Take 1,000 mcg by mouth daily.      [provider]      Allergies    Patient has no known allergies.    Review of Systems   Review of Systems  Constitutional:  Positive for fatigue. Negative for fever.  Respiratory:  Positive for chest tightness. Negative for shortness of breath.   Cardiovascular:  Positive for chest pain. Negative for palpitations and leg swelling.  Gastrointestinal:  Negative for abdominal pain, diarrhea and vomiting.  Musculoskeletal:  Positive for back pain.  Skin:  Negative for rash.  Neurological:  Negative for headaches.    Physical Exam Updated Vital Signs BP 129/83   Pulse 88   Temp 98 F (36.7 C) (Oral)   Resp (!) 5   Ht 5' 7"$  (1.702 m)   Wt 86.2 kg   SpO2 96%   BMI 29.76 kg/m  Physical Exam Vitals and nursing note reviewed.  Constitutional:      General: She is not in acute distress.    Appearance: Normal appearance. She is not ill-appearing.  HENT:     Head: Normocephalic.     Mouth/Throat:     Mouth: Mucous membranes are moist.  Cardiovascular:     Rate and Rhythm: Normal rate.  Pulmonary:     Effort: Pulmonary effort is normal. No respiratory distress.  Abdominal:     Palpations: Abdomen is soft.     Tenderness: There is no abdominal tenderness.  Musculoskeletal:  Comments: Equal radial pulses  Skin:    General: Skin is warm.  Neurological:     Mental Status: She is alert and oriented to person, place, and time. Mental status is at baseline.  Psychiatric:        Mood and Affect: Mood normal.     ED Results / Procedures / Treatments   Labs (all labs ordered are listed, but only abnormal results are displayed) Labs Reviewed  BASIC METABOLIC PANEL - Abnormal; Notable for the following components:      Result Value   Glucose, Bld 103 (*)    Calcium 8.6 (*)    All other components within normal limits  CBC - Abnormal; Notable for the following components:   Hemoglobin 15.8 (*)    HCT 47.2 (*)    All other components within normal limits   TROPONIN I (HIGH SENSITIVITY) - Abnormal; Notable for the following components:   Troponin I (High Sensitivity) 401 (*)    All other components within normal limits  PROTIME-INR  APTT  HEMOGLOBIN A1C  LIPID PANEL  I-STAT BETA HCG BLOOD, ED ([facility], [facility], [facility] ONLY)  TROPONIN I (HIGH SENSITIVITY)    EKG EKG Interpretation  Date/Time:  Wednesday July 06 2022 12:15:13 EST Ventricular Rate:  91 PR Interval:  155 QRS Duration: 84 QT Interval:  392 QTC Calculation: 483 R Axis:   64 Text Interpretation: Sinus rhythm Anteroseptal infarct, age indeterminate Baseline wander in lead(s) V2 V4 V5 V6 Confirmed by Lavenia Atlas [id number]) on 07/06/2022 1:20:47 PM  Radiology DG Chest 2 View  Result Date: 07/06/2022 CLINICAL DATA:  Chest pressure. EXAM: CHEST - 2 VIEW COMPARISON:  02/19/2020 FINDINGS: The lungs are clear without focal pneumonia, edema, pneumothorax or pleural effusion. Cardiopericardial silhouette is at upper limits of normal for size. Stable opacity right cardiophrenic angle compatible with fat pad seen on CT of 05/11/2005. The visualized bony structures of the thorax are unremarkable. IMPRESSION: No active cardiopulmonary disease. Electronically Signed   By: Misty Stanley M.D.   On: 07/06/2022 12:29    Procedures Procedures    Medications Ordered in ED Medications  0.9 %  sodium chloride infusion ( Intravenous New Bag/Given 07/06/22 1351)  nitroGLYCERIN (NITROSTAT) SL tablet 0.4 mg (0.4 mg Sublingual Given 07/06/22 1418)  heparin bolus via infusion 4,000 Units (has no administration in time range)  heparin ADULT infusion 100 units/mL (25000 units/222m) (has no administration in time range)  aspirin chewable tablet 324 mg (324 mg Oral Given 07/06/22 1351)    ED Course/ Medical Decision Making/ A&P                             Medical Decision Making Amount and/or Complexity of Data Reviewed Labs: ordered. Radiology: ordered.  Risk OTC drugs. Prescription drug  management. Decision regarding hospitalization.   47year old female presents emergency department with midsternal chest heaviness.  Was at its most severe last night when she was laying down for bed.  Vitals are stable on arrival.  EKG is sinus rhythm, no significant ischemic changes.  Still complaining of about 2/10 heaviness in the midsternal area.  Blood work shows an elevated troponin over 400, downtrending to 351.  Patient given aspirin, sublingual nitroglycerin with mild improvement and heparin.  Dr. CBurt Knack cardiology will admit the patient, [facility]  Patients evaluation and results requires admission for further treatment and care. Patient agrees with admission plan,  offers no new complaints and is stable/unchanged at time of admit.        Final Clinical Impression(s) / ED Diagnoses Final diagnoses:  None    Rx / DC Orders ED Discharge Orders     None         Lorelle Gibbs, DO 07/06/22 1643

## 2022-07-06 NOTE — Telephone Encounter (Signed)
Reason for Disposition  Pain also in shoulder(s) or arm(s) or jaw  (Exception: Pain is clearly made worse by movement.)  Answer Assessment - Initial Assessment Questions 1. LOCATION: "Where does it hurt?"       I'm having chest for the last 2 evenings.   Last night for a couple of hours I had bad chest pain. Middle between my breasts.   The top of my back between my shoulder blades hurts and on the sides of my neck.    My left shoulder was hurting.    2. RADIATION: "Does the pain go anywhere else?" (e.g., into neck, jaw, arms, back)     Between my shoulder blades and up both sides of my neck and my left shoulder. 3. ONSET: "When did the chest pain begin?" (Minutes, hours or days)      2 days ago intermittently but it's getting worse. 4. PATTERN: "Does the pain come and go, or has it been constant since it started?"  "Does it get worse with exertion?"      Intermittently. 5. DURATION: "How long does it last" (e.g., seconds, minutes, hours)     Last night it went on for a couple of hours 6. SEVERITY: "How bad is the pain?"  (e.g., Scale 1-10; mild, moderate, or severe)    - MILD (1-3): doesn't interfere with normal activities     - MODERATE (4-7): interferes with normal activities or awakens from sleep    - SEVERE (8-10): excruciating pain, unable to do any normal activities       "Bad last night 7. CARDIAC RISK FACTORS: "Do you have any history of heart problems or risk factors for heart disease?" (e.g., angina, prior heart attack; diabetes, high blood pressure, high cholesterol, smoker, or strong family history of heart disease)     No 8. PULMONARY RISK FACTORS: "Do you have any history of lung disease?"  (e.g., blood clots in lung, asthma, emphysema, birth control pills)     Not asked 9. CAUSE: "What do you think is causing the chest pain?"     Not asked 10. OTHER SYMPTOMS: "Do you have any other symptoms?" (e.g., dizziness, nausea, vomiting, sweating, fever, difficulty breathing, cough)        Referred on to the ED.    She works at [employer] so she is going to the ED there. 11. PREGNANCY: "Is there any chance you are pregnant?" "When was your last menstrual period?"       Not asked  Protocols used: Chest Pain-A-AH

## 2022-07-06 NOTE — ED Triage Notes (Signed)
Pt states she has been having chest pain, more pressure at night, pain is central chest, radiates to back and up into neck. Nothing seems to relieve.

## 2022-07-06 NOTE — ED Notes (Signed)
ED TO INPATIENT HANDOFF REPORT  Name/Age/Gender Anna Cameron 47 y.o. female  Code Status   Home/SNF/Other Home  Chief Complaint NSTEMI (non-ST elevated myocardial infarction) ([place]) [I21.4]  Level of Care/Admitting Diagnosis ED Disposition     ED Disposition  Admit   Condition  --   Comment  Hospital Area: [place] [[id number]]  Level of Care: Med-Surg [16]  May admit patient to [facility] or [facility] if equivalent level of care is available:: No  Covid Evaluation: Asymptomatic - no recent exposure (last 10 days) testing not required  Diagnosis: NSTEMI (non-ST elevated myocardial infarction) [id number]  Admitting Physician: [place], [place]  Attending Physician: Lorelle Gibbs [id number]  Certification:: I certify this patient is being admitted for an inpatient-only procedure  Estimated Length of Stay: 2          Medical History Past Medical History:  Diagnosis Date   Allergy    Anxiety    Depression    [name]    Tobacco abuse     Allergies No Known Allergies  IV Location/Drains/Wounds Patient Lines/Drains/Airways Status     Active Line/Drains/Airways     Name Placement date Placement time Site Days   Peripheral IV 07/06/22 20 G Anterior;Left Forearm 07/06/22  1350  Forearm  less than 1            Labs/Imaging Results for orders placed or performed during the hospital encounter of 07/06/22 (from the past 48 hour(s))  Basic metabolic panel     Status: Abnormal   Collection Time: 07/06/22 12:17 PM  Result Value Ref Range   Sodium 143 135 - 145 mmol/L   Potassium 3.6 3.5 - 5.1 mmol/L   Chloride 108 98 - 111 mmol/L   CO2 25 22 - 32 mmol/L   Glucose, Bld 103 (H) 70 - 99 mg/dL    Comment: Glucose reference range applies only to samples taken after fasting for at least 8 hours.   BUN 17 6 - 20 mg/dL   Creatinine, Ser 0.80 0.44 - 1.00 mg/dL   Calcium 8.6 (L) 8.9 - 10.3 mg/dL   GFR, Estimated >60 >60  mL/min    Comment: (NOTE) Calculated using the CKD-EPI Creatinine Equation (2021)    Anion gap 10 5 - 15    Comment: Performed at [facility], [place] [address], [place], [place] [address]  CBC     Status: Abnormal   Collection Time: 07/06/22 12:17 PM  Result Value Ref Range   WBC 8.9 4.0 - 10.5 K/uL   RBC 4.96 3.87 - 5.11 MIL/uL   Hemoglobin 15.8 (H) 12.0 - 15.0 g/dL   HCT 47.2 (H) 36.0 - 46.0 %   MCV 95.2 80.0 - 100.0 fL   MCH 31.9 26.0 - 34.0 pg   MCHC 33.5 30.0 - 36.0 g/dL   RDW 12.6 11.5 - 15.5 %   Platelets 301 150 - 400 K/uL   nRBC 0.0 0.0 - 0.2 %    Comment: Performed at [facility], [place] [address], [place], [place] [address]  Troponin I (High Sensitivity)     Status: Abnormal   Collection Time: 07/06/22 12:17 PM  Result Value Ref Range   Troponin I (High Sensitivity) 401 (HH) <18 ng/L    Comment: CRITICAL RESULT CALLED TO, READ BACK BY AND VERIFIED WITH RN Buford Dresser AT 1319 07/06/22 CRUICKSHANK A (NOTE) Elevated high sensitivity troponin I (hsTnI) values and significant  changes across serial measurements may  suggest ACS but many other  chronic and acute conditions are known to elevate hsTnI results.  Refer to the "Links" section for chest pain algorithms and additional  guidance. Performed at [facility], [place] [address], [place], [place] [address]   I-Stat beta hCG blood, ED     Status: None   Collection Time: 07/06/22 12:35 PM  Result Value Ref Range   I-stat hCG, quantitative <5.0 <5 mIU/mL   Comment 3            Comment:   GEST. AGE      CONC.  (mIU/mL)   <=1 WEEK        5 - 50     2 WEEKS       50 - 500     3 WEEKS       100 - 10,000     4 WEEKS     1,000 - 30,000        FEMALE AND NON-PREGNANT FEMALE:     LESS THAN 5 mIU/mL   Protime-INR     Status: None   Collection Time: 07/06/22  1:55 PM  Result Value Ref Range   Prothrombin Time 12.2 11.4 - 15.2 seconds   INR 0.9 0.8 - 1.2    Comment:  (NOTE) INR goal varies based on device and disease states. Performed at [facility], [place] [address], [place], [place] [address]   APTT     Status: None   Collection Time: 07/06/22  1:55 PM  Result Value Ref Range   aPTT 29 24 - 36 seconds    Comment: Performed at [facility], [place] [address], [place], [place] [address]  Lipid panel     Status: Abnormal   Collection Time: 07/06/22  1:55 PM  Result Value Ref Range   Cholesterol 258 (H) 0 - 200 mg/dL   Triglycerides 184 (H) <150 mg/dL   HDL 41 >40 mg/dL   Total CHOL/HDL Ratio 6.3 RATIO   VLDL 37 0 - 40 mg/dL   LDL Cholesterol 180 (H) 0 - 99 mg/dL    Comment:        Total Cholesterol/HDL:CHD Risk Coronary Heart Disease Risk Table                     Men   Women  1/2 Average Risk   3.4   3.3  Average Risk       5.0   4.4  2 X Average Risk   9.6   7.1  3 X Average Risk  23.4   11.0        Use the calculated Patient Ratio above and the CHD Risk Table to determine the patient's CHD Risk.        ATP III CLASSIFICATION (LDL):  <100     mg/dL   Optimal  100-129  mg/dL   Near or Above                    Optimal  130-159  mg/dL   Borderline  160-189  mg/dL   High  >190     mg/dL   Very High Performed at [place] [address], [place], [place] [address]   Troponin I (High Sensitivity)     Status: Abnormal   Collection Time: 07/06/22  1:55 PM  Result Value Ref Range   Troponin I (High Sensitivity) 351 (HH) <18 ng/L    Comment:  CRITICAL VALUE NOTED. VALUE IS CONSISTENT WITH PREVIOUSLY REPORTED/CALLED VALUE DELTA CHECK NOTED (NOTE) Elevated high sensitivity troponin I (hsTnI) values and significant  changes across serial measurements may suggest ACS but many other  chronic and acute conditions are known to elevate hsTnI results.  Refer to the "Links" section for chest pain algorithms and additional  guidance. Performed at [facility], [place]  [address] [place], [place] [address]    DG Chest 2 View  Result Date: 07/06/2022 CLINICAL DATA:  Chest pressure. EXAM: CHEST - 2 VIEW COMPARISON:  02/19/2020 FINDINGS: The lungs are clear without focal pneumonia, edema, pneumothorax or pleural effusion. Cardiopericardial silhouette is at upper limits of normal for size. Stable opacity right cardiophrenic angle compatible with fat pad seen on CT of 05/11/2005. The visualized bony structures of the thorax are unremarkable. IMPRESSION: No active cardiopulmonary disease. Electronically Signed   By: Misty Stanley M.D.   On: 07/06/2022 12:29    Pending Labs Unresulted Labs (From admission, onward)     Start     Ordered   07/07/22 0500  CBC  Daily,   R      07/06/22 1503   07/06/22 2100  Heparin level (unfractionated)  Once-Timed,   URGENT        07/06/22 1503   07/06/22 1329  Hemoglobin A1c  (Stemi Panel (PNL))  ONCE - URGENT,   URGENT        07/06/22 1329            Vitals/Pain Today's Vitals   07/06/22 1630 07/06/22 1700 07/06/22 1730 07/06/22 1830  BP: (!) 124/112 131/74 127/85 133/88  Pulse: 92 86 81 83  Resp: 16 14 (!) 21 20  Temp:      TempSrc:      SpO2: 99% 96% 95% 96%  Weight:      Height:      PainSc:        Isolation Precautions No active isolations  Medications Medications  0.9 %  sodium chloride infusion ( Intravenous New Bag/Given 07/06/22 1351)  nitroGLYCERIN (NITROSTAT) SL tablet 0.4 mg (0.4 mg Sublingual Given 07/06/22 1418)  heparin ADULT infusion 100 units/mL (25000 units/258m) (950 Units/hr Intravenous Infusion Verify 07/06/22 1814)  aspirin chewable tablet 324 mg (324 mg Oral Given 07/06/22 1351)  heparin bolus via infusion 4,000 Units (4,000 Units Intravenous Bolus from Bag 07/06/22 1501)    Mobility walks

## 2022-07-07 ENCOUNTER — Inpatient Hospital Stay (HOSPITAL_COMMUNITY): Payer: 59

## 2022-07-07 ENCOUNTER — Encounter (HOSPITAL_COMMUNITY)
Admission: EM | Disposition: A | Discharge status: Home / Self Care | Payer: Self-pay | Source: Home / Self Care | Attending: Internal Medicine

## 2022-07-07 ENCOUNTER — Other Ambulatory Visit: Payer: Self-pay

## 2022-07-07 DIAGNOSIS — E785 Hyperlipidemia, unspecified: Secondary | ICD-10-CM

## 2022-07-07 DIAGNOSIS — E876 Hypokalemia: Secondary | ICD-10-CM

## 2022-07-07 DIAGNOSIS — I214 Non-ST elevation (NSTEMI) myocardial infarction: Secondary | ICD-10-CM | POA: Diagnosis not present

## 2022-07-07 DIAGNOSIS — I2510 Atherosclerotic heart disease of native coronary artery without angina pectoris: Secondary | ICD-10-CM

## 2022-07-07 HISTORY — PX: LEFT HEART CATH AND CORONARY ANGIOGRAPHY: CATH118249

## 2022-07-07 HISTORY — PX: CORONARY STENT INTERVENTION: CATH118234

## 2022-07-07 LAB — CBC WITH DIFFERENTIAL/PLATELET
Abs Immature Granulocytes: 0.02 10*3/uL (ref 0.00–0.07)
Basophils Absolute: 0.1 10*3/uL (ref 0.0–0.1)
Basophils Relative: 1 %
Eosinophils Absolute: 0.3 10*3/uL (ref 0.0–0.5)
Eosinophils Relative: 4 %
HCT: 43.5 % (ref 36.0–46.0)
Hemoglobin: 15.2 g/dL — ABNORMAL HIGH (ref 12.0–15.0)
Immature Granulocytes: 0 %
Lymphocytes Relative: 33 %
Lymphs Abs: 2.7 10*3/uL (ref 0.7–4.0)
MCH: 32.4 pg (ref 26.0–34.0)
MCHC: 34.9 g/dL (ref 30.0–36.0)
MCV: 92.8 fL (ref 80.0–100.0)
Monocytes Absolute: 0.7 10*3/uL (ref 0.1–1.0)
Monocytes Relative: 8 %
Neutro Abs: 4.5 10*3/uL (ref 1.7–7.7)
Neutrophils Relative %: 54 %
Platelets: 281 10*3/uL (ref 150–400)
RBC: 4.69 MIL/uL (ref 3.87–5.11)
RDW: 12.6 % (ref 11.5–15.5)
WBC: 8.3 10*3/uL (ref 4.0–10.5)
nRBC: 0 % (ref 0.0–0.2)

## 2022-07-07 LAB — BASIC METABOLIC PANEL
Anion gap: 9 (ref 5–15)
BUN: 15 mg/dL (ref 6–20)
CO2: 25 mmol/L (ref 22–32)
Calcium: 9.4 mg/dL (ref 8.9–10.3)
Chloride: 106 mmol/L (ref 98–111)
Creatinine, Ser: 0.72 mg/dL (ref 0.44–1.00)
GFR, Estimated: >60 mL/min (ref >60)
Glucose, Bld: 86 mg/dL (ref 70–99)
Potassium: 3.4 mmol/L — ABNORMAL LOW (ref 3.5–5.1)
Sodium: 140 mmol/L (ref 135–145)

## 2022-07-07 LAB — CBC
HCT: 44.5 % (ref 36.0–46.0)
Hemoglobin: 15.9 g/dL — ABNORMAL HIGH (ref 12.0–15.0)
MCH: 32.3 pg (ref 26.0–34.0)
MCHC: 35.7 g/dL (ref 30.0–36.0)
MCV: 90.4 fL (ref 80.0–100.0)
Platelets: 305 10*3/uL (ref 150–400)
RBC: 4.92 MIL/uL (ref 3.87–5.11)
RDW: 12.3 % (ref 11.5–15.5)
WBC: 9.3 10*3/uL (ref 4.0–10.5)
nRBC: 0 % (ref 0.0–0.2)

## 2022-07-07 LAB — HIV ANTIBODY (ROUTINE TESTING W REFLEX): HIV Screen 4th Generation wRfx: NONREACTIVE

## 2022-07-07 LAB — HEPARIN LEVEL (UNFRACTIONATED)
Heparin Unfractionated: 0.13 IU/mL — ABNORMAL LOW (ref 0.30–0.70)
Heparin Unfractionated: 0.3 IU/mL (ref 0.30–0.70)

## 2022-07-07 LAB — POCT ACTIVATED CLOTTING TIME: Activated Clotting Time: 309 seconds

## 2022-07-07 SURGERY — LEFT HEART CATH AND CORONARY ANGIOGRAPHY
Anesthesia: LOCAL

## 2022-07-07 MED ORDER — LIDOCAINE HCL (PF) 1 % IJ SOLN
INTRAMUSCULAR | Status: AC
  Filled 2022-07-07: qty 30

## 2022-07-07 MED ORDER — SODIUM CHLORIDE 0.9 % IV SOLN: 250.0000 mL | INTRAVENOUS | Status: DC | PRN

## 2022-07-07 MED ORDER — MIDAZOLAM HCL 2 MG/2ML IJ SOLN
INTRAMUSCULAR | Status: DC | PRN
  Administered 2022-07-07 (×2): 1 mg via INTRAVENOUS

## 2022-07-07 MED ORDER — SODIUM CHLORIDE 0.9% FLUSH
3.0000 mL | Freq: Two times a day (BID) | INTRAVENOUS | Status: DC
  Administered 2022-07-08: 3 mL via INTRAVENOUS

## 2022-07-07 MED ORDER — SODIUM CHLORIDE 0.9% FLUSH
3.0000 mL | Freq: Two times a day (BID) | INTRAVENOUS | Status: DC
  Administered 2022-07-07: 3 mL via INTRAVENOUS

## 2022-07-07 MED ORDER — ONDANSETRON HCL 4 MG/2ML IJ SOLN: 4.0000 mg | Freq: Four times a day (QID) | INTRAMUSCULAR | Status: DC | PRN

## 2022-07-07 MED ORDER — MORPHINE SULFATE (PF) 2 MG/ML IV SOLN
2.0000 mg | INTRAVENOUS | Status: DC | PRN
  Administered 2022-07-07: 2 mg via INTRAVENOUS
  Filled 2022-07-07: qty 1

## 2022-07-07 MED ORDER — SODIUM CHLORIDE 0.9 % WEIGHT BASED INFUSION
1.0000 mL/kg/h | INTRAVENOUS | Status: DC
  Administered 2022-07-07: 1 mL/kg/h via INTRAVENOUS

## 2022-07-07 MED ORDER — TICAGRELOR 90 MG PO TABS
90.0000 mg | ORAL_TABLET | Freq: Two times a day (BID) | ORAL | Status: DC
  Administered 2022-07-07 – 2022-07-08 (×2): 90 mg via ORAL
  Filled 2022-07-07 (×2): qty 1

## 2022-07-07 MED ORDER — TICAGRELOR 90 MG PO TABS
ORAL_TABLET | ORAL | Status: DC | PRN
  Administered 2022-07-07: 180 mg via ORAL

## 2022-07-07 MED ORDER — HYDRALAZINE HCL 20 MG/ML IJ SOLN: 10.0000 mg | INTRAMUSCULAR | Status: AC | PRN

## 2022-07-07 MED ORDER — POTASSIUM CHLORIDE CRYS ER 20 MEQ PO TBCR
40.0000 meq | EXTENDED_RELEASE_TABLET | Freq: Once | ORAL | Status: AC
  Administered 2022-07-07: 40 meq via ORAL
  Filled 2022-07-07: qty 2

## 2022-07-07 MED ORDER — MIDAZOLAM HCL 2 MG/2ML IJ SOLN
INTRAMUSCULAR | Status: AC
  Filled 2022-07-07: qty 2

## 2022-07-07 MED ORDER — ACETAMINOPHEN 325 MG PO TABS
650.0000 mg | ORAL_TABLET | ORAL | Status: DC | PRN
  Administered 2022-07-08: 650 mg via ORAL
  Filled 2022-07-07: qty 2

## 2022-07-07 MED ORDER — SODIUM CHLORIDE 0.9% FLUSH: 3.0000 mL | INTRAVENOUS | Status: DC | PRN

## 2022-07-07 MED ORDER — HEPARIN SODIUM (PORCINE) 1000 UNIT/ML IJ SOLN
INTRAMUSCULAR | Status: DC | PRN
  Administered 2022-07-07: 5000 [IU] via INTRA_ARTERIAL
  Administered 2022-07-07: 4000 [IU] via INTRAVENOUS

## 2022-07-07 MED ORDER — VERAPAMIL HCL 2.5 MG/ML IV SOLN
INTRAVENOUS | Status: DC | PRN
  Administered 2022-07-07: 10 mL via INTRA_ARTERIAL

## 2022-07-07 MED ORDER — LABETALOL HCL 5 MG/ML IV SOLN: 10.0000 mg | INTRAVENOUS | Status: AC | PRN

## 2022-07-07 MED ORDER — ASPIRIN 81 MG PO CHEW
81.0000 mg | CHEWABLE_TABLET | ORAL | Status: AC
  Administered 2022-07-07: 81 mg via ORAL
  Filled 2022-07-07: qty 1

## 2022-07-07 MED ORDER — ASPIRIN 81 MG PO CHEW
81.0000 mg | CHEWABLE_TABLET | Freq: Every day | ORAL | Status: DC
  Administered 2022-07-08: 81 mg via ORAL
  Filled 2022-07-07: qty 1

## 2022-07-07 MED ORDER — FENTANYL CITRATE (PF) 100 MCG/2ML IJ SOLN
INTRAMUSCULAR | Status: AC
  Filled 2022-07-07: qty 2

## 2022-07-07 MED ORDER — LIDOCAINE HCL (PF) 1 % IJ SOLN
INTRAMUSCULAR | Status: DC | PRN
  Administered 2022-07-07: 2 mL

## 2022-07-07 MED ORDER — HYDRALAZINE HCL 20 MG/ML IJ SOLN
INTRAMUSCULAR | Status: AC
  Filled 2022-07-07: qty 1

## 2022-07-07 MED ORDER — HEPARIN (PORCINE) IN NACL 1000-0.9 UT/500ML-% IV SOLN
INTRAVENOUS | Status: DC | PRN
  Administered 2022-07-07 (×2): 500 mL

## 2022-07-07 MED ORDER — SODIUM CHLORIDE 0.9 % WEIGHT BASED INFUSION: 3.0000 mL/kg/h | INTRAVENOUS | Status: DC

## 2022-07-07 MED ORDER — NICOTINE 21 MG/24HR TD PT24
21.0000 mg | MEDICATED_PATCH | Freq: Every day | TRANSDERMAL | Status: DC
  Administered 2022-07-07 – 2022-07-08 (×2): 21 mg via TRANSDERMAL
  Filled 2022-07-07 (×2): qty 1

## 2022-07-07 MED ORDER — FENTANYL CITRATE (PF) 100 MCG/2ML IJ SOLN
INTRAMUSCULAR | Status: DC | PRN
  Administered 2022-07-07 (×2): 25 ug via INTRAVENOUS

## 2022-07-07 MED ORDER — SODIUM CHLORIDE 0.9 % IV SOLN: INTRAVENOUS | Status: AC

## 2022-07-07 MED ORDER — TICAGRELOR 90 MG PO TABS
ORAL_TABLET | ORAL | Status: AC
  Filled 2022-07-07: qty 2

## 2022-07-07 MED ORDER — IOHEXOL 350 MG/ML SOLN
INTRAVENOUS | Status: DC | PRN
  Administered 2022-07-07: 80 mL via INTRA_ARTERIAL

## 2022-07-07 MED ORDER — HYDRALAZINE HCL 20 MG/ML IJ SOLN
INTRAMUSCULAR | Status: DC | PRN
  Administered 2022-07-07: 10 mg via INTRAVENOUS

## 2022-07-07 SURGICAL SUPPLY — 19 items
BALLN EMERGE MR 2.5X12 (BALLOONS) ×1
BALLN ~~LOC~~ EMERGE MR 2.5X12 (BALLOONS) ×1
BALLOON EMERGE MR 2.5X12 (BALLOONS) IMPLANT
BALLOON ~~LOC~~ EMERGE MR 2.5X12 (BALLOONS) IMPLANT
CATH INFINITI 5FR ANG PIGTAIL (CATHETERS) IMPLANT
CATH LAUNCHER 6FR EBU3.5 (CATHETERS) IMPLANT
CATH OPTITORQUE TIG 4.0 6F (CATHETERS) IMPLANT
DEVICE RAD COMP TR BAND LRG (VASCULAR PRODUCTS) IMPLANT
GLIDESHEATH SLEND SS 6F .021 (SHEATH) IMPLANT
GUIDEWIRE INQWIRE 1.5J.035X260 (WIRE) IMPLANT
GUIDEWIRE VAS SION BLUE 190 (WIRE) IMPLANT
INQWIRE 1.5J .035X260CM (WIRE) ×1
KIT HEART LEFT (KITS) ×1 IMPLANT
KIT HEMO VALVE WATCHDOG (MISCELLANEOUS) IMPLANT
PACK CARDIAC CATHETERIZATION (CUSTOM PROCEDURE TRAY) ×1 IMPLANT
STENT SYNERGY XD 2.50X16 (Permanent Stent) IMPLANT
SYNERGY XD 2.50X16 (Permanent Stent) ×1 IMPLANT
TRANSDUCER W/STOPCOCK (MISCELLANEOUS) ×1 IMPLANT
TUBING CIL FLEX 10 FLL-RA (TUBING) ×1 IMPLANT

## 2022-07-07 NOTE — Progress Notes (Signed)
Pt in cath lab.

## 2022-07-07 NOTE — Progress Notes (Signed)
Called pt having some chest pressure and mild SOB,  EKG stable actually looks better. Will give morphine to resolve for now.   Cecilie Kicks, FNP-C [facility]  [id number] or after 5pm and on weekends call [phone] 07/07/2022.now

## 2022-07-07 NOTE — Progress Notes (Addendum)
Rounding Note    Patient Name: Anna Cameron Date of Encounter: 07/07/2022  [place] Cardiologist: New  Subjective   Admitted overnight for NSTEMI. Patient presented with intermittent chest heaviness at rest for the last 3 days. She had some radiation to her upper back and left arm the night prior as well as some associated shortness of breath, nausea, and diaphoresis. She is currently chest pain free and is not having any shortness of breath or nausea.   Inpatient Medications    Scheduled Meds:  aspirin EC  81 mg Oral Daily   atorvastatin  80 mg Oral QHS   metoprolol tartrate  12.5 mg Oral BID   sodium chloride flush  3 mL Intravenous Q12H   Continuous Infusions:  sodium chloride 20 mL/hr at 07/06/22 1351   sodium chloride     sodium chloride 1 mL/kg/hr (07/07/22 [id number])   heparin 1,400 Units/hr (07/07/22 0320)   PRN Meds: sodium chloride, acetaminophen, nitroGLYCERIN, ondansetron (ZOFRAN) IV, sodium chloride flush   Vital Signs    Vitals:   07/06/22 1830 07/06/22 2019 07/06/22 2205 07/07/22 0019  BP: 133/88 (!) 144/95  132/64  Pulse: 83 78 81   Resp: 20     Temp:  98.2 F (36.8 C)  97.8 F (36.6 C)  TempSrc:  Oral  Oral  SpO2: 96% 96%    Weight:  89.2 kg  89.2 kg  Height:  5' 7"$  (1.702 m)      Intake/Output Summary (Last 24 hours) at 07/07/2022 [id number] Last data filed at 07/06/2022 1814 Gross per 24 hour  Intake 70.12 ml  Output --  Net 70.12 ml      07/07/2022   12:19 AM 07/06/2022    8:19 PM 07/06/2022   12:13 PM  Last 3 Weights  Weight (lbs) 196 lb 10.4 oz 196 lb 10.4 oz 190 lb  Weight (kg) 89.2 kg 89.2 kg 86.183 kg      Telemetry    Normal sinus rhythm with occasional PVCs. Rates in the 45s. - Personally Reviewed  ECG    No new ECG tracing today. - Personally Reviewed  Physical Exam   GEN: Caucasian female resting comfortably in no acute distress.   Neck: No JVD. Cardiac: RRR. No murmurs, rubs, or gallops. Radial pulses 2+ and  equal bilaterally. Respiratory: Clear to auscultation bilaterally. No wheezes, rhonchi, or rales. GI: Soft, non-distended, and non-tender. MS: No lower extremity edema. No deformity. Skin: Warm and dry. Neuro:  No focal deficits. Psych: Normal affect. Responds appropriately.  Labs    High Sensitivity Troponin:   Recent Labs  Lab 07/06/22 1217 07/06/22 1355  TROPONINIHS 401* 351*     Chemistry Recent Labs  Lab 07/06/22 1217 07/07/22 0036  NA 143 140  K 3.6 3.4*  CL 108 106  CO2 25 25  GLUCOSE 103* 86  BUN 17 15  CREATININE 0.80 0.72  CALCIUM 8.6* 9.4  GFRNONAA >60 >60  ANIONGAP 10 9    Lipids  Recent Labs  Lab 07/06/22 1355  CHOL 258*  TRIG 184*  HDL 41  LDLCALC 180*  CHOLHDL 6.3    Hematology Recent Labs  Lab 07/06/22 1217 07/07/22 0036  WBC 8.9 8.3  RBC 4.96 4.69  HGB 15.8* 15.2*  HCT 47.2* 43.5  MCV 95.2 92.8  MCH 31.9 32.4  MCHC 33.5 34.9  RDW 12.6 12.6  PLT 301 281   Thyroid No results for input(s): "TSH", "FREET4" in the last 168 hours.  BNPNo  results for input(s): "BNP", "PROBNP" in the last 168 hours.  DDimer No results for input(s): "DDIMER" in the last 168 hours.   Radiology    DG Chest 2 View  Result Date: 07/06/2022 CLINICAL DATA:  Chest pressure. EXAM: CHEST - 2 VIEW COMPARISON:  02/19/2020 FINDINGS: The lungs are clear without focal pneumonia, edema, pneumothorax or pleural effusion. Cardiopericardial silhouette is at upper limits of normal for size. Stable opacity right cardiophrenic angle compatible with fat pad seen on CT of 05/11/2005. The visualized bony structures of the thorax are unremarkable. IMPRESSION: No active cardiopulmonary disease. Electronically Signed   By: Misty Stanley M.D.   On: 07/06/2022 12:29    Cardiac Studies   N/A  Patient Profile     47 y.o. female with a history of erythrocytosis previously treated with phlebotomy, anxiety/ depression, stress incontinence, tobacco abuse, and prior alcohol abuse but no  known cardiac history prior to admission who was admitted on 07/06/2022 for NSTEMI after presenting with intermittent chest pain.  Assessment & Plan    NSTEMI Patient presented with intermittent chest heaviness for 3 days prior to admission. EKG showed non-specific ST changes. High-sensitivity troponin 401 >> 351.  - Currently chest pain free. - Echo pending. - Continue IV Heparin.  - Continue aspirin, beta-blocker, and high-intensity statin. - She is currently on the board for cardiac catheterization this afternoon. The patient understands that risks include but are not limited to stroke (1 in 1000), death (1 in 10), kidney failure [usually temporary] (1 in 500), bleeding (1 in 200), allergic reaction [possibly serious] (1 in 200), and agrees to proceed.   Elevated BP without Diagnosis of Hypertension Patient has no formal diagnosis of hypertension. BP mildly elevated at times.  - Started on Lopressor 12.27m twice daily in setting of NSTEMI. Continue. - Will continue to monitor BP and adjust medications as needed.  Hyperlipidemia Lipid panel this admission: Total Cholesterol 258, Triglycerides 184, HDL 41, LDL 180.   - Started on Lipitor 811mdaily. - Will need repeat lipid panel and LFTs in 6-8 weeks.  [name] History of erythrocytosis previously treated with phlebotomy. Hemoglobin mildly elevated at 15.8 on admission. - Hemoglobin stable at 15.2 today. - Continue to monitor.  Hypokalemia Potassium 3.4 today. - Repleted. - Will repeat BMET tomorrow.  Tobacco Abuse Patient has a significant smoking history. She has smoked 1.5 packs per day since she was 89ears old. - Discussed the importance of complete cessation.  - Will order Nicotine patch.  For questions or updates, please contact [facility] consult [email or web address] for contact info under   Signed, CaDarreld McleanPA-C  07/07/2022, 6:37 AM   As above, pt seen and examined; no further CP; she  has ruled in for NSTEMI; continue ASA, metoprolol, statin and heparin. For cath today (risk and benefits including MI, CVA and death discussed and patient agrees to proceed; check echo for LV function. Pt counseled on discontinuing tobacco. Significantly elevated LDL; continue crestor; check lipids and liver 8 weeks. Fu with primary care for mildly elevated Hgb as outpt.   BrKirk Ruths

## 2022-07-07 NOTE — Interval H&P Note (Signed)
History and Physical Interval Note:  07/07/2022 1:58 PM  Anna Cameron  has presented today for surgery, with the diagnosis of nstemi.  The various methods of treatment have been discussed with the patient and family. After consideration of risks, benefits and other options for treatment, the patient has consented to  Procedure(s): LEFT HEART CATH AND CORONARY ANGIOGRAPHY (N/A) as a surgical intervention.  The patient's history has been reviewed, patient examined, no change in status, stable for surgery.  I have reviewed the patient's chart and labs.  Questions were answered to the patient's satisfaction.     Cath Lab Visit (complete for each Cath Lab visit)  Clinical Evaluation Leading to the Procedure:   ACS: Yes.    Non-ACS:    Anginal Classification: CCS IV  Anti-ischemic medical therapy: Maximal Therapy (2 or more classes of medications)  Non-Invasive Test Results: No non-invasive testing performed  Prior CABG: No previous CABG        Early [place]

## 2022-07-07 NOTE — H&P (View-Only) (Signed)
Rounding Note    Patient Name: Anna Cameron Date of Encounter: 07/07/2022  [place] Cardiologist: New  Subjective   Admitted overnight for NSTEMI. Patient presented with intermittent chest heaviness at rest for the last 3 days. She had some radiation to her upper back and left arm the night prior as well as some associated shortness of breath, nausea, and diaphoresis. She is currently chest pain free and is not having any shortness of breath or nausea.   Inpatient Medications    Scheduled Meds:  aspirin EC  81 mg Oral Daily   atorvastatin  80 mg Oral QHS   metoprolol tartrate  12.5 mg Oral BID   sodium chloride flush  3 mL Intravenous Q12H   Continuous Infusions:  sodium chloride 20 mL/hr at 07/06/22 1351   sodium chloride     sodium chloride 1 mL/kg/hr (07/07/22 [id number])   heparin 1,400 Units/hr (07/07/22 0320)   PRN Meds: sodium chloride, acetaminophen, nitroGLYCERIN, ondansetron (ZOFRAN) IV, sodium chloride flush   Vital Signs    Vitals:   07/06/22 1830 07/06/22 2019 07/06/22 2205 07/07/22 0019  BP: 133/88 (!) 144/95  132/64  Pulse: 83 78 81   Resp: 20     Temp:  98.2 F (36.8 C)  97.8 F (36.6 C)  TempSrc:  Oral  Oral  SpO2: 96% 96%    Weight:  89.2 kg  89.2 kg  Height:  5' 7"$  (1.702 m)      Intake/Output Summary (Last 24 hours) at 07/07/2022 [id number] Last data filed at 07/06/2022 1814 Gross per 24 hour  Intake 70.12 ml  Output --  Net 70.12 ml      07/07/2022   12:19 AM 07/06/2022    8:19 PM 07/06/2022   12:13 PM  Last 3 Weights  Weight (lbs) 196 lb 10.4 oz 196 lb 10.4 oz 190 lb  Weight (kg) 89.2 kg 89.2 kg 86.183 kg      Telemetry    Normal sinus rhythm with occasional PVCs. Rates in the 3s. - Personally Reviewed  ECG    No new ECG tracing today. - Personally Reviewed  Physical Exam   GEN: Caucasian female resting comfortably in no acute distress.   Neck: No JVD. Cardiac: RRR. No murmurs, rubs, or gallops. Radial pulses 2+ and  equal bilaterally. Respiratory: Clear to auscultation bilaterally. No wheezes, rhonchi, or rales. GI: Soft, non-distended, and non-tender. MS: No lower extremity edema. No deformity. Skin: Warm and dry. Neuro:  No focal deficits. Psych: Normal affect. Responds appropriately.  Labs    High Sensitivity Troponin:   Recent Labs  Lab 07/06/22 1217 07/06/22 1355  TROPONINIHS 401* 351*     Chemistry Recent Labs  Lab 07/06/22 1217 07/07/22 0036  NA 143 140  K 3.6 3.4*  CL 108 106  CO2 25 25  GLUCOSE 103* 86  BUN 17 15  CREATININE 0.80 0.72  CALCIUM 8.6* 9.4  GFRNONAA >60 >60  ANIONGAP 10 9    Lipids  Recent Labs  Lab 07/06/22 1355  CHOL 258*  TRIG 184*  HDL 41  LDLCALC 180*  CHOLHDL 6.3    Hematology Recent Labs  Lab 07/06/22 1217 07/07/22 0036  WBC 8.9 8.3  RBC 4.96 4.69  HGB 15.8* 15.2*  HCT 47.2* 43.5  MCV 95.2 92.8  MCH 31.9 32.4  MCHC 33.5 34.9  RDW 12.6 12.6  PLT 301 281   Thyroid No results for input(s): "TSH", "FREET4" in the last 168 hours.  BNPNo  results for input(s): "BNP", "PROBNP" in the last 168 hours.  DDimer No results for input(s): "DDIMER" in the last 168 hours.   Radiology    DG Chest 2 View  Result Date: 07/06/2022 CLINICAL DATA:  Chest pressure. EXAM: CHEST - 2 VIEW COMPARISON:  02/19/2020 FINDINGS: The lungs are clear without focal pneumonia, edema, pneumothorax or pleural effusion. Cardiopericardial silhouette is at upper limits of normal for size. Stable opacity right cardiophrenic angle compatible with fat pad seen on CT of 05/11/2005. The visualized bony structures of the thorax are unremarkable. IMPRESSION: No active cardiopulmonary disease. Electronically Signed   By: Misty Stanley M.D.   On: 07/06/2022 12:29    Cardiac Studies   N/A  Patient Profile     47 y.o. female with a history of erythrocytosis previously treated with phlebotomy, anxiety/ depression, stress incontinence, tobacco abuse, and prior alcohol abuse but no  known cardiac history prior to admission who was admitted on 07/06/2022 for NSTEMI after presenting with intermittent chest pain.  Assessment & Plan    NSTEMI Patient presented with intermittent chest heaviness for 3 days prior to admission. EKG showed non-specific ST changes. High-sensitivity troponin 401 >> 351.  - Currently chest pain free. - Echo pending. - Continue IV Heparin.  - Continue aspirin, beta-blocker, and high-intensity statin. - She is currently on the board for cardiac catheterization this afternoon. The patient understands that risks include but are not limited to stroke (1 in 1000), death (1 in 83), kidney failure [usually temporary] (1 in 500), bleeding (1 in 200), allergic reaction [possibly serious] (1 in 200), and agrees to proceed.   Elevated BP without Diagnosis of Hypertension Patient has no formal diagnosis of hypertension. BP mildly elevated at times.  - Started on Lopressor 12.52m twice daily in setting of NSTEMI. Continue. - Will continue to monitor BP and adjust medications as needed.  Hyperlipidemia Lipid panel this admission: Total Cholesterol 258, Triglycerides 184, HDL 41, LDL 180.   - Started on Lipitor 872mdaily. - Will need repeat lipid panel and LFTs in 6-8 weeks.  [name] History of erythrocytosis previously treated with phlebotomy. Hemoglobin mildly elevated at 15.8 on admission. - Hemoglobin stable at 15.2 today. - Continue to monitor.  Hypokalemia Potassium 3.4 today. - Repleted. - Will repeat BMET tomorrow.  Tobacco Abuse Patient has a significant smoking history. She has smoked 1.5 packs per day since she was [age]ears old. - Discussed the importance of complete cessation.  - Will order Nicotine patch.  For questions or updates, please contact [facility] consult [email or web address] for contact info under   Signed, CaDarreld McleanPA-C  07/07/2022, 6:37 AM   As above, pt seen and examined; no further CP; she  has ruled in for NSTEMI; continue ASA, metoprolol, statin and heparin. For cath today (risk and benefits including MI, CVA and death discussed and patient agrees to proceed; check echo for LV function. Pt counseled on discontinuing tobacco. Significantly elevated LDL; continue crestor; check lipids and liver 8 weeks. Fu with primary care for mildly elevated Hgb as outpt.   BrKirk Ruths

## 2022-07-07 NOTE — Progress Notes (Signed)
ANTICOAGULATION CONSULT NOTE Pharmacy Consult for Heparin Indication: chest pain/ACS  No Known Allergies  Patient Measurements: Height: 5' 7"$  (170.2 cm) Weight: 89.2 kg (196 lb 10.4 oz) IBW/kg (Calculated) : 61.6 Heparin Dosing Weight: 79.8 kg  Vital Signs: Temp: 97.8 F (36.6 C) (02/15 0019) Temp Source: Oral (02/15 0019) BP: 132/64 (02/15 0019) Pulse Rate: 81 (02/14 2205)  Labs: Recent Labs    07/06/22 1217 07/06/22 1355 07/06/22 2048 07/07/22 0036  HGB 15.8*  --   --  15.2*  HCT 47.2*  --   --  43.5  PLT 301  --   --  281  APTT  --  29  --   --   LABPROT  --  12.2  --   --   INR  --  0.9  --   --   HEPARINUNFRC  --   --  <0.10* 0.13*  CREATININE 0.80  --   --  0.72  TROPONINIHS 401* 351*  --   --      Estimated Creatinine Clearance: 100.7 mL/min (by C-G formula based on SCr of 0.72 mg/dL).   Medical History: Past Medical History:  Diagnosis Date   Allergy    Anxiety    Depression    [name]    Tobacco abuse     Medications:  Scheduled:   aspirin EC  81 mg Oral Daily   atorvastatin  80 mg Oral QHS   metoprolol tartrate  12.5 mg Oral BID   Infusions:   sodium chloride 20 mL/hr at 07/06/22 1351   heparin 1,150 Units/hr (07/06/22 2205)   PRN: acetaminophen, nitroGLYCERIN, ondansetron (ZOFRAN) IV  Assessment: 47 yo female presents with chest pain, troponin elevated.  Pharmacy consulted to dose IV heparin.  Heparin level subtherapeutic: 0.13 on 1150 units/hr, no infusion issues or overt s/sx of bleeding. CBC stable. Currently scheduled to go for cardiac cath later today at 1330   Goal of Therapy:  Heparin level 0.3-0.7 units/ml Monitor platelets by anticoagulation protocol: Yes   Plan:  Increase heparin to 1400 units / hr Follow up AM heparin level or after cath  Georga Bora, PharmD Clinical Pharmacist 07/07/2022 3:11 AM Please check AMION for all [place] numbers

## 2022-07-07 NOTE — Progress Notes (Signed)
Patient complained of slight chest pain/pressure midsternal 4/10 nonradiating. L. [employer], notified. Obtained EKG.

## 2022-07-07 NOTE — Progress Notes (Signed)
ANTICOAGULATION CONSULT NOTE Pharmacy Consult for Heparin Indication: chest pain/ACS  No Known Allergies  Patient Measurements: Height: 5' 7"$  (170.2 cm) Weight: 89.2 kg (196 lb 10.4 oz) IBW/kg (Calculated) : 61.6 Heparin Dosing Weight: 79.8 kg  Vital Signs: Temp: 98.4 F (36.9 C) (02/15 0841) Temp Source: Oral (02/15 0841) BP: 109/95 (02/15 0841) Pulse Rate: 67 (02/15 0841)  Labs: Recent Labs    07/06/22 1217 07/06/22 1355 07/06/22 2048 07/07/22 0036 07/07/22 1044  HGB 15.8*  --   --  15.2*  --   HCT 47.2*  --   --  43.5  --   PLT 301  --   --  281  --   APTT  --  29  --   --   --   LABPROT  --  12.2  --   --   --   INR  --  0.9  --   --   --   HEPARINUNFRC  --   --  <0.10* 0.13* 0.30  CREATININE 0.80  --   --  0.72  --   TROPONINIHS 401* 351*  --   --   --      Estimated Creatinine Clearance: 100.7 mL/min (by C-G formula based on SCr of 0.72 mg/dL).   Medical History: Past Medical History:  Diagnosis Date   Allergy    Anxiety    Depression    [name]    Tobacco abuse     Medications:  Scheduled:   aspirin EC  81 mg Oral Daily   atorvastatin  80 mg Oral QHS   metoprolol tartrate  12.5 mg Oral BID   nicotine  21 mg Transdermal Daily   sodium chloride flush  3 mL Intravenous Q12H   Infusions:   sodium chloride 20 mL/hr at 07/06/22 1351   sodium chloride     sodium chloride 1 mL/kg/hr (07/07/22 1029)   heparin 1,400 Units/hr (07/07/22 1028)   PRN: sodium chloride, acetaminophen, nitroGLYCERIN, ondansetron (ZOFRAN) IV, sodium chloride flush  Assessment: 47 yo female presents with chest pain, troponin elevated.  Pharmacy consulted to dose IV heparin.  Heparin level just therapeutic at 0.3 on 1400 units/hr. No infusion or  bleeding issues noted. CBC stable. Currently scheduled to go for cardiac cath later today at 1330   Goal of Therapy:  Heparin level 0.3-0.7 units/ml Monitor platelets by anticoagulation protocol: Yes   Plan:  Increase  heparin to 1500 units / hr Follow up after cath   Thank you for allowing [place] to participate in this patients care. Jens Som, PharmD 07/07/2022 11:46 AM  **Pharmacist phone directory can be found on [email or web address] listed under [place]**

## 2022-07-08 ENCOUNTER — Inpatient Hospital Stay (HOSPITAL_COMMUNITY): Payer: 59

## 2022-07-08 ENCOUNTER — Encounter (HOSPITAL_COMMUNITY): Payer: Self-pay | Admitting: Internal Medicine

## 2022-07-08 ENCOUNTER — Other Ambulatory Visit (HOSPITAL_COMMUNITY): Payer: Self-pay

## 2022-07-08 ENCOUNTER — Encounter: Payer: Self-pay | Admitting: Hematology and Oncology

## 2022-07-08 DIAGNOSIS — E785 Hyperlipidemia, unspecified: Secondary | ICD-10-CM | POA: Insufficient documentation

## 2022-07-08 DIAGNOSIS — I2510 Atherosclerotic heart disease of native coronary artery without angina pectoris: Secondary | ICD-10-CM | POA: Insufficient documentation

## 2022-07-08 DIAGNOSIS — I214 Non-ST elevation (NSTEMI) myocardial infarction: Secondary | ICD-10-CM

## 2022-07-08 DIAGNOSIS — R030 Elevated blood-pressure reading, without diagnosis of hypertension: Secondary | ICD-10-CM | POA: Insufficient documentation

## 2022-07-08 LAB — CBC
HCT: 43.9 % (ref 36.0–46.0)
Hemoglobin: 15.6 g/dL — ABNORMAL HIGH (ref 12.0–15.0)
MCH: 32.3 pg (ref 26.0–34.0)
MCHC: 35.5 g/dL (ref 30.0–36.0)
MCV: 90.9 fL (ref 80.0–100.0)
Platelets: 283 10*3/uL (ref 150–400)
RBC: 4.83 MIL/uL (ref 3.87–5.11)
RDW: 12.3 % (ref 11.5–15.5)
WBC: 10.5 10*3/uL (ref 4.0–10.5)
nRBC: 0 % (ref 0.0–0.2)

## 2022-07-08 LAB — ECHOCARDIOGRAM COMPLETE
Area-P 1/2: 3.01 cm2
Height: 67 in
S' Lateral: 3 cm
Weight: 3146.41 oz

## 2022-07-08 LAB — BASIC METABOLIC PANEL
Anion gap: 8 (ref 5–15)
BUN: 15 mg/dL (ref 6–20)
CO2: 23 mmol/L (ref 22–32)
Calcium: 9 mg/dL (ref 8.9–10.3)
Chloride: 108 mmol/L (ref 98–111)
Creatinine, Ser: 0.72 mg/dL (ref 0.44–1.00)
GFR, Estimated: >60 mL/min (ref >60)
Glucose, Bld: 86 mg/dL (ref 70–99)
Potassium: 3.6 mmol/L (ref 3.5–5.1)
Sodium: 139 mmol/L (ref 135–145)

## 2022-07-08 MED ORDER — ASPIRIN 81 MG PO CHEW
81.0000 mg | CHEWABLE_TABLET | Freq: Every day | ORAL | 3 refills | Status: AC
  Filled 2022-07-08 – 2022-10-04 (×2): qty 90, 90d supply, fill #0

## 2022-07-08 MED ORDER — METOPROLOL TARTRATE 25 MG PO TABS
12.5000 mg | ORAL_TABLET | Freq: Two times a day (BID) | ORAL | 2 refills | Status: DC
  Filled 2022-07-08: qty 30, 30d supply, fill #0

## 2022-07-08 MED ORDER — NITROGLYCERIN 0.4 MG SL SUBL
0.4000 mg | SUBLINGUAL_TABLET | SUBLINGUAL | 2 refills | Status: AC | PRN
  Filled 2022-07-08: qty 25, 7d supply, fill #0

## 2022-07-08 MED ORDER — ATORVASTATIN CALCIUM 80 MG PO TABS
80.0000 mg | ORAL_TABLET | Freq: Every day | ORAL | 2 refills | Status: DC
  Filled 2022-07-08 – 2022-08-08 (×3): qty 30, 30d supply, fill #0
  Filled 2022-09-05: qty 30, 30d supply, fill #1

## 2022-07-08 MED ORDER — TICAGRELOR 90 MG PO TABS
90.0000 mg | ORAL_TABLET | Freq: Two times a day (BID) | ORAL | 11 refills | Status: DC
  Filled 2022-07-08 – 2022-08-08 (×2): qty 60, 30d supply, fill #0
  Filled 2022-09-05: qty 60, 30d supply, fill #1
  Filled 2022-11-23: qty 60, 30d supply, fill #2
  Filled 2023-01-11 – 2023-01-30 (×2): qty 60, 30d supply, fill #3

## 2022-07-08 MED ORDER — NICOTINE 21 MG/24HR TD PT24
21.0000 mg | MEDICATED_PATCH | Freq: Every day | TRANSDERMAL | 0 refills | Status: DC
  Filled 2022-07-08: qty 28, 28d supply, fill #0

## 2022-07-08 NOTE — Progress Notes (Signed)
CARDIAC REHAB PHASE I   PRE:  Rate/Rhythm: 86 SR  BP:  Sitting: 120/50      SaO2: 96 RA  MODE:  Ambulation: 340 ft   POST:  Rate/Rhythm: 85 SR  BP:  Sitting: 121/66      SaO2: 96 RA  Pt tolerated exercise well and amb 340 ft independently. Pt denies CP, SOB, or dizziness throughout walk. Pt educated on Stent, MI, heart healthy nutrition, smoking cessation, exercise guidelines and CRP II.  [id number]  Vilinda Blanks, RRT, BSRT 07/08/2022 9:58 AM

## 2022-07-08 NOTE — Discharge Summary (Signed)
Discharge Summary    Patient ID: Anna Cameron MRN: [id number]; DOB: 28-Nov-1975  Admit date: 07/06/2022 Discharge date: 07/08/2022  PCP:  Juline Patch, MD   [place] Providers Cardiologist:  None   {  Discharge Diagnoses    Principal Problem:   NSTEMI (non-ST elevated myocardial infarction) [facility]) Active Problems:   CAD (coronary artery disease)   Elevated BP without diagnosis of hypertension   Hyperlipidemia   [name]   Tobacco abuse    Diagnostic Studies/Procedures    Left Cardiac Catheterization 07/07/2022:   1st Diag lesion is 95% stenosed.   A stent was successfully placed.   Post intervention, there is a 0% residual stenosis.   1.  High-grade first diagonal lesion treated with 1 drug-eluting stent. 2.  LVEDP of 14 mmHg.   Recommendation: Dual antiplatelet therapy for 1 year and optimal medical therapy for cardiovascular risk factors.  Diagnostic Dominance: Right  Intervention      _____________   Echocardiogram 07/08/2022: Impressions: 1. Left ventricular ejection fraction, by estimation, is 60 to 65%. The  left ventricle has normal function. The left ventricle has no regional  wall motion abnormalities. Left ventricular diastolic parameters are  consistent with Grade I diastolic  dysfunction (impaired relaxation).   2. Right ventricular systolic function is normal. The right ventricular  size is normal. There is normal pulmonary artery systolic pressure. The  estimated right ventricular systolic pressure is 99991111 mmHg.   3. Left atrial size was mildly dilated.   4. The mitral valve is normal in structure. No evidence of mitral valve  regurgitation. No evidence of mitral stenosis.   5. Tricuspid valve regurgitation is mild to moderate.   6. The aortic valve is normal in structure. Aortic valve regurgitation is  not visualized. No aortic stenosis is present.   7. The inferior vena cava is normal in size with greater than 50%   respiratory variability, suggesting right atrial pressure of 3 mmHg.   History of Present Illness     Anna Cameron is a 47 y.o. female with with depression, anxiety, stress incontinence, tobacco abuse, sobriety from prior ETOH use, erythrocytosis (previously tx with phlebotomy) but no known cardiac history prior to admission who was admitted on 07/06/2022 for the evaluation of NSTEMI.   Patient presented to the [facility] ED on 07/06/2022 for further evaluation of chest heaviness for the prior 3 days. Episodes of chest pain have not been exertional. Pain initially lasted for 10 minutes but then got progressively longer. On the evening of 07/05/2022, she had severe pain that radiated to her back and left arm with associated diaphoresis. This occurred while she was laying down. She continued to have mid-sternal heaviness on morning of 07/06/2022 and presented to the ED for further evaluation.  EKG showed normal sinus rhythm with non-specific ST changes. High-sensitivity troponin elevated at 401 >> 351. Chest x-ray showed no acute findings. WBC 8.9, Hgb 15.8, Plts 301. Na 143, K 3.6, Glucose 103, BUN 17, Cr 0.8. She was started on IV Heparin and admitted at [facility].  Hospital Course     Consultants: None  NSTEMI Patient presented with intermittent chest heaviness for 3 days prior to admission. EKG showed non-specific ST changes. High-sensitivity troponin 401 >> 351. She underwent left heart catheterization on 07/07/2022 which showed 95% stenosis of the 1st Diag which was was successful treated with DES. Otherwise, coronaries were clean. Echo showed LVEF of 60-65%, no regional wall motion abnormalities, grade 1 diastolic  dysfunction, normal RV, and mild to moderate TR . She was started on DAPT with Aspirin 27m daily and Brilinta 999mtwice daily. She was also started on Lopressor 12.70m28mwice daily and Lipitor 97m63mily.  Of note, patient takes BC powder and Ibuprofen frequently for headaches.  Recommended avoiding both and taking Tylenol instead. Recommended taking OTC Omeprazole if she absolutely needs the BC p[facility] or Ibuprofen. Also Recommended she follow-up with PCP for additional management of headaches. She is also on Estradiol for hot flashes but takes she does not take this regularly. Recommended stopping this as well given MI.   Elevated BP without Diagnosis of Hypertension Patient has no formal diagnosis of hypertension. BP was initially mildly elevated. She was started on low dose Lopressor in setting of NSTEMI with improvement in BP. In fact, BP has been soft at times since starting this. Continue Lopressor 12.70mg 78mce daily at discharged.    Hyperlipidemia Lipid panel this admission: Total Cholesterol 258, Triglycerides 184, HDL 41, LDL 180.  Started on Lipitor 97mg 570my this admission. Will need repeat lipid panel and LFTs in 6-8 weeks.   [name] History of erythrocytosis previously treated with phlebotomy. Hemoglobin mildly elevated at 15.8 on admission (looks like it has been as high as 17.8). Hemoglobin stable at 15.6 on day of discharge. Follow-up with PCP.  Hypokalemia - Resolved Potassium was mildly low at 3.4 on 07/07/2022. She was supplemented and potassium improved to 3.6 on day of discharge. Consider repeat BMET at follow-up visit.   Tobacco Abuse Patient has a significant smoking history. She has smoked 1.5 packs per day since she was 17 yea84 old. Discussed the importance of complete cessation. Will provide Nicotine patches at discharge.  Patient seen and examined by Dr. CrenshStanford Breed and determined to be stable for discharge. Outpatient follow-up arranged. Medications as below.   Did the patient have an acute coronary syndrome (MI, NSTEMI, STEMI, etc) this admission?:  Yes                               AHA/ACC Clinical Performance & Quality Measures: Aspirin prescribed? - Yes ADP Receptor Inhibitor (Plavix/Clopidogrel, Brilinta/Ticagrelor or  Effient/Prasugrel) prescribed (includes medically managed patients)? - Yes Beta Blocker prescribed? - Yes High Intensity Statin (Lipitor 40-97mg o33mestor 20-40mg) p82mribed? - Yes EF assessed during THIS hospitalization? - Yes For EF <40%, was ACEI/ARB prescribed? - Not Applicable (EF >/= 40%) [id number] <40%, Aldosterone Antagonist (Spironolactone or Eplerenone) prescribed? - Not Applicable (EF >/= 40%) [id number] Rehab Phase II ordered (including medically managed patients)? - Yes   _____________  Discharge Vitals Blood pressure 117/77, pulse 75, temperature 98.7 F (37.1 C), temperature source Oral, resp. rate 17, height 5' 7"$  (1.702 m), weight 89.2 kg, SpO2 94 %.  Filed Weights   07/06/22 1213 07/06/22 2019 07/07/22 0019  Weight: 86.2 kg 89.2 kg 89.2 kg   General: 47 y.o. 65ucasian female resting comfortably in no acute distress. HEENT: Normocephalic and atraumatic. Sclera clear.  Neck: Supple.  Heart: RRR. Distinct S1 and S2. No murmurs, gallops, or rubs. Radial  pulses 2+ and equal bilaterally. Right radial cath site soft with mild ecchymosis but no signs of hematoma.  Lungs: No increased work of breathing. Clear to ausculation bilaterally. No wheezes, rhonchi, or rales.  Abdomen: Soft, non-distended, and non-tender to palpation.  Extremities: No lower extremity edema.    Skin: Warm and dry. Neuro: Alert and oriented x3. No focal deficits.  Psych: Normal affect. Responds appropriately.   Labs & Radiologic Studies    CBC Recent Labs    07/07/22 0036 07/07/22 1904 07/08/22 0013  WBC 8.3 9.3 10.5  NEUTROABS 4.5  --   --   HGB 15.2* 15.9* 15.6*  HCT 43.5 44.5 43.9  MCV 92.8 90.4 90.9  PLT 281 305 [id number]   Basic Metabolic Panel Recent Labs    07/07/22 0036 07/08/22 0013  NA 140 139  K 3.4* 3.6  CL 106 108  CO2 25 23  GLUCOSE 86 86  BUN 15 15  CREATININE 0.72 0.72  CALCIUM 9.4 9.0   Liver Function Tests No results for input(s): "AST", "ALT", "ALKPHOS", "BILITOT",  "PROT", "ALBUMIN" in the last 72 hours. No results for input(s): "LIPASE", "AMYLASE" in the last 72 hours. High Sensitivity Troponin:   Recent Labs  Lab 07/06/22 1217 07/06/22 1355  TROPONINIHS 401* 351*    BNP Invalid input(s): "POCBNP" D-Dimer No results for input(s): "DDIMER" in the last 72 hours. Hemoglobin A1C Recent Labs    07/06/22 1355  HGBA1C 5.6   Fasting Lipid Panel Recent Labs    07/06/22 1355  CHOL 258*  HDL 41  LDLCALC 180*  TRIG 184*  CHOLHDL 6.3   Thyroid Function Tests No results for input(s): "TSH", "T4TOTAL", "T3FREE", "THYROIDAB" in the last 72 hours.  Invalid input(s): "FREET3" _____________  ECHOCARDIOGRAM COMPLETE  Result Date: 07/08/2022    ECHOCARDIOGRAM REPORT   Patient Name:   JOELLYN ZOLTOWSKI Date of Exam: 07/08/2022 Medical Rec #:  [id number]       Height:       67.0 in Accession #:    [id number]      Weight:       196.6 lb Date of Birth:  December 30, 1975        BSA:          2.008 m Patient Age:    86 years        BP:           114/65 mmHg Patient Gender: F               HR:           70 bpm. Exam Location:  Inpatient Procedure: 2D Echo, Color Doppler and Cardiac Doppler Indications:    NSTEMI  History:        Patient has no prior history of Echocardiogram examinations.  Sonographer:    Raquel Sarna Senior RDCS Referring Phys: [id number] EZEQUIEL D MUNOZ GONZALEZ  Sonographer Comments: Suboptimal apical window due to rib interference. IMPRESSIONS  1. Left ventricular ejection fraction, by estimation, is 60 to 65%. The left ventricle has normal function. The left ventricle has no regional wall motion abnormalities. Left ventricular diastolic parameters are consistent with Grade I diastolic dysfunction (impaired relaxation).  2. Right ventricular systolic function is normal. The right ventricular size is normal. There is normal pulmonary artery systolic pressure. The estimated right ventricular systolic pressure is 99991111 mmHg.  3. Left atrial size was mildly dilated.  4.  The mitral valve is normal in structure. No evidence of mitral valve regurgitation. No evidence of mitral stenosis.  5. Tricuspid valve regurgitation is mild to moderate.  6. The aortic valve is normal in structure. Aortic valve regurgitation is not visualized. No aortic stenosis is present.  7. The inferior vena cava is normal in size with greater than 50% respiratory variability, suggesting right atrial pressure of 3 mmHg. FINDINGS  Left Ventricle: Left ventricular ejection fraction,  by estimation, is 60 to 65%. The left ventricle has normal function. The left ventricle has no regional wall motion abnormalities. The left ventricular internal cavity size was normal in size. There is  no left ventricular hypertrophy. Left ventricular diastolic parameters are consistent with Grade I diastolic dysfunction (impaired relaxation). Right Ventricle: The right ventricular size is normal. No increase in right ventricular wall thickness. Right ventricular systolic function is normal. There is normal pulmonary artery systolic pressure. The tricuspid regurgitant velocity is 2.34 m/s, and  with an assumed right atrial pressure of 3 mmHg, the estimated right ventricular systolic pressure is 99991111 mmHg. Left Atrium: Left atrial size was mildly dilated. Right Atrium: Right atrial size was normal in size. Pericardium: There is no evidence of pericardial effusion. Mitral Valve: The mitral valve is normal in structure. No evidence of mitral valve regurgitation. No evidence of mitral valve stenosis. Tricuspid Valve: The tricuspid valve is normal in structure. Tricuspid valve regurgitation is mild to moderate. No evidence of tricuspid stenosis. Aortic Valve: The aortic valve is normal in structure. Aortic valve regurgitation is not visualized. No aortic stenosis is present. Pulmonic Valve: The pulmonic valve was normal in structure. Pulmonic valve regurgitation is not visualized. No evidence of pulmonic stenosis. Aorta: The aortic root  is normal in size and structure. Venous: The inferior vena cava is normal in size with greater than 50% respiratory variability, suggesting right atrial pressure of 3 mmHg. IAS/Shunts: No atrial level shunt detected by color flow Doppler.  LEFT VENTRICLE PLAX 2D LVIDd:         4.60 cm   Diastology LVIDs:         3.00 cm   LV e' medial:    7.40 cm/s LV PW:         0.80 cm   LV E/e' medial:  6.6 LV IVS:        1.00 cm   LV e' lateral:   5.87 cm/s LVOT diam:     2.10 cm   LV E/e' lateral: 8.3 LV SV:         54 LV SV Index:   27 LVOT Area:     3.46 cm  RIGHT VENTRICLE RV S prime:     14.50 cm/s TAPSE (M-mode): 2.0 cm LEFT ATRIUM             Index        RIGHT ATRIUM           Index LA diam:        3.10 cm 1.54 cm/m   RA Area:     17.50 cm LA Vol (A2C):   40.7 ml 20.27 ml/m  RA Volume:   52.20 ml  26.00 ml/m LA Vol (A4C):   38.6 ml 19.23 ml/m LA Biplane Vol: 42.6 ml 21.22 ml/m  AORTIC VALVE LVOT Vmax:   72.40 cm/s LVOT Vmean:  53.800 cm/s LVOT VTI:    0.157 m  AORTA Ao Root diam: 3.20 cm MITRAL VALVE               TRICUSPID VALVE MV Area (PHT): 3.01 cm    TR Peak grad:   21.9 mmHg MV Decel Time: 252 msec    TR Vmax:        234.00 cm/s MV E velocity: 48.70 cm/s MV A velocity: 68.00 cm/s  SHUNTS MV E/A ratio:  0.72        Systemic VTI:  0.16 m  Systemic Diam: 2.10 cm Candee Furbish MD Electronically signed by Candee Furbish MD Signature Date/Time: 07/08/2022/11:17:22 AM    Final    CARDIAC CATHETERIZATION  Result Date: 07/08/2022   1st Diag lesion is 95% stenosed.   A stent was successfully placed.   Post intervention, there is a 0% residual stenosis. 1.  High-grade first diagonal lesion treated with 1 drug-eluting stent. 2.  LVEDP of 14 mmHg. Recommendation: Dual antiplatelet therapy for 1 year and optimal medical therapy for cardiovascular risk factors.   DG Chest 2 View  Result Date: 07/06/2022 CLINICAL DATA:  Chest pressure. EXAM: CHEST - 2 VIEW COMPARISON:  02/19/2020 FINDINGS: The  lungs are clear without focal pneumonia, edema, pneumothorax or pleural effusion. Cardiopericardial silhouette is at upper limits of normal for size. Stable opacity right cardiophrenic angle compatible with fat pad seen on CT of 05/11/2005. The visualized bony structures of the thorax are unremarkable. IMPRESSION: No active cardiopulmonary disease. Electronically Signed   By: Misty Stanley M.D.   On: 07/06/2022 12:29   Disposition   Patient is being discharged home today in good condition.  Follow-up Plans & Appointments     Follow-up Information     Darreld Mclean, PA-C Follow up.   Specialty: Cardiology Why: Hospital follow-up with Cardiology scheduled for 07/27/2022 at 2:20pm. Please arrive 15 minutes early for check-in. If this date/time does not work for you, please call our office to reschedule. Contact information: [address] [place] [address] [phone]                Discharge Instructions     Amb Referral to Cardiac Rehabilitation   Complete by: As directed    Diagnosis:  Coronary Stents NSTEMI     After initial evaluation and assessments completed: Virtual Based Care may be provided alone or in conjunction with Phase 2 Cardiac Rehab based on patient barriers.: Yes   Intensive Cardiac Rehabilitation (ICR) [place] location only OR Traditional Cardiac Rehabilitation (TCR) *If criteria for ICR are not met will enroll in TCR [facility] only): Yes   Diet - low sodium heart healthy   Complete by: As directed    Increase activity slowly   Complete by: As directed         Discharge Medications   Allergies as of 07/08/2022   No Known Allergies      Medication List     STOP taking these medications    BC HEADACHE POWDER PO   buPROPion 300 MG 24 hr tablet Commonly known as: Wellbutrin XL   ciprofloxacin 250 MG tablet Commonly known as: Cipro   doxepin 10 MG capsule Commonly known as: SINEQUAN   estradiol 1 MG tablet Commonly known as:  ESTRACE   ibuprofen 200 MG tablet Commonly known as: ADVIL       TAKE these medications    aspirin 81 MG chewable tablet Chew 1 tablet (81 mg total) by mouth daily.   atorvastatin 80 MG tablet Commonly known as: LIPITOR Take 1 tablet (80 mg total) by mouth at bedtime.   cyanocobalamin 1000 MCG tablet Commonly known as: VITAMIN B12 Take 1,000 mcg by mouth in the morning.   fexofenadine 180 MG tablet Commonly known as: ALLEGRA Take 180 mg by mouth in the morning.   Gemtesa 75 MG Tabs Generic drug: Vibegron Take 1 tablet (75 mg total) by mouth daily. What changed: when to take this   metoprolol tartrate 25 MG tablet Commonly known as: LOPRESSOR Take 1/2 tablet (12.5  mg total) by mouth 2 (two) times daily.   multivitamin with minerals Tabs tablet Take 1 tablet by mouth daily with breakfast.   nicotine 21 mg/24hr patch Commonly known as: NICODERM CQ - dosed in mg/24 hours Place 1 patch (21 mg total) onto the skin daily.   nitroGLYCERIN 0.4 MG SL tablet Commonly known as: NITROSTAT Place 1 tablet (0.4 mg total) under the tongue every 5 (five) minutes as needed for chest pain.   ticagrelor 90 MG Tabs tablet Commonly known as: BRILINTA Take 1 tablet (90 mg total) by mouth 2 (two) times daily.           Outstanding Labs/Studies   Repeat BMET at follow-up visit. Repeat lipid panel and LFTs in 6-8 weeks.  Duration of Discharge Encounter   Greater than 30 minutes including physician time.  Signed, Darreld Mclean, PA-C 07/08/2022, 12:12 PM

## 2022-07-08 NOTE — Progress Notes (Signed)
Rounding Note    Patient Name: Anna Cameron Date of Encounter: 07/08/2022  [facility] Cardiologist: Dr Stanford Breed  Subjective   No CP or dyspnea  Inpatient Medications    Scheduled Meds:  aspirin  81 mg Oral Daily   atorvastatin  80 mg Oral QHS   metoprolol tartrate  12.5 mg Oral BID   nicotine  21 mg Transdermal Daily   sodium chloride flush  3 mL Intravenous Q12H   ticagrelor  90 mg Oral BID   Continuous Infusions:  sodium chloride 20 mL/hr at 07/06/22 1351   sodium chloride     PRN Meds: sodium chloride, acetaminophen, morphine injection, nitroGLYCERIN, ondansetron (ZOFRAN) IV, sodium chloride flush   Vital Signs    Vitals:   07/07/22 2142 07/07/22 2312 07/08/22 0550 07/08/22 0739  BP: 119/68 (!) 123/57 117/68 114/65  Pulse: 86  76 92  Resp: 20 16 20 18  $ Temp:  98.7 F (37.1 C) 98.4 F (36.9 C) 98.6 F (37 C)  TempSrc:  Oral Oral Oral  SpO2: 95% 94% 95% 96%  Weight:      Height:        Intake/Output Summary (Last 24 hours) at 07/08/2022 0823 Last data filed at 07/08/2022 0700 Gross per 24 hour  Intake 923 ml  Output --  Net 923 ml      07/07/2022   12:19 AM 07/06/2022    8:19 PM 07/06/2022   12:13 PM  Last 3 Weights  Weight (lbs) 196 lb 10.4 oz 196 lb 10.4 oz 190 lb  Weight (kg) 89.2 kg 89.2 kg 86.183 kg      Telemetry    Sinus - Personally Reviewed   Physical Exam   GEN: No acute distress.   Neck: No JVD Cardiac: RRR, no murmurs, rubs, or gallops.  Respiratory: Clear to auscultation bilaterally. GI: Soft, nontender, non-distended  MS: No edema; radial cath site with no hematoma Neuro:  Nonfocal  Psych: Normal affect   Labs    High Sensitivity Troponin:   Recent Labs  Lab 07/06/22 1217 07/06/22 1355  TROPONINIHS 401* 351*     Chemistry Recent Labs  Lab 07/06/22 1217 07/07/22 0036 07/08/22 0013  NA 143 140 139  K 3.6 3.4* 3.6  CL 108 106 108  CO2 25 25 23  $ GLUCOSE 103* 86 86  BUN 17 15 15  $ CREATININE 0.80  0.72 0.72  CALCIUM 8.6* 9.4 9.0  GFRNONAA >60 >60 >60  ANIONGAP 10 9 8    $ Lipids  Recent Labs  Lab 07/06/22 1355  CHOL 258*  TRIG 184*  HDL 41  LDLCALC 180*  CHOLHDL 6.3    Hematology Recent Labs  Lab 07/07/22 0036 07/07/22 1904 07/08/22 0013  WBC 8.3 9.3 10.5  RBC 4.69 4.92 4.83  HGB 15.2* 15.9* 15.6*  HCT 43.5 44.5 43.9  MCV 92.8 90.4 90.9  MCH 32.4 32.3 32.3  MCHC 34.9 35.7 35.5  RDW 12.6 12.3 12.3  PLT 281 305 283    Radiology    CARDIAC CATHETERIZATION  Result Date: 07/08/2022   1st Diag lesion is 95% stenosed.   A stent was successfully placed.   Post intervention, there is a 0% residual stenosis. 1.  High-grade first diagonal lesion treated with 1 drug-eluting stent. 2.  LVEDP of 14 mmHg. Recommendation: Dual antiplatelet therapy for 1 year and optimal medical therapy for cardiovascular risk factors.   DG Chest 2 View  Result Date: 07/06/2022 CLINICAL DATA:  Chest pressure. EXAM: CHEST -  2 VIEW COMPARISON:  02/19/2020 FINDINGS: The lungs are clear without focal pneumonia, edema, pneumothorax or pleural effusion. Cardiopericardial silhouette is at upper limits of normal for size. Stable opacity right cardiophrenic angle compatible with fat pad seen on CT of 05/11/2005. The visualized bony structures of the thorax are unremarkable. IMPRESSION: No active cardiopulmonary disease. Electronically Signed   By: Misty Stanley M.D.   On: 07/06/2022 12:29      Patient Profile     47 y.o. female with past medical history of tobacco abuse, erythrocytosis admitted with non-ST elevation myocardial infarction.  Cardiac catheterization revealed a 95% first diagonal which was treated with 1 drug-eluting stent.  Assessment & Plan    1 non-ST elevation myocardial infarction-patient is status post PCI of the diagonal.  Continue aspirin, Brilinta, Lipitor and metoprolol.  Await echocardiogram for LV function.  2 hyperlipidemia-continue Lipitor at present dose.  Check lipids and  liver in 8 weeks.  Goal LDL 55.  3 tobacco abuse-patient counseled on discontinuing.  4 erythrocytosis-follow-up with primary care following discharge.  Plan discharge today on present medications.  Will arrange follow-up with APP in 2 to 4 weeks.  Follow-up with me in 3 months.  Greater than 30 minutes PA and physician time. D2  For questions or updates, please contact [place] Please consult [email or web address] for contact info under        Signed, Anna Ruths, MD  07/08/2022, 8:23 AM

## 2022-07-08 NOTE — Discharge Instructions (Addendum)
Medication Changes: - START Aspirin 40m daily and Brilinta 960mtwice daily. These medications are very important in helping keep the new stent in your heart open. - START Metoprolol tartrate (Lopressor) 12.61m73mwice daily. - START Atorvastatin (Lipitor) 49m96mily. - STOP Estradiol.  - Recommend using over-the-counter Tylenol as needed for headache. Would recommend avoiding NSAID (Ibuprofen, Motrin, Advil, Aleve, Meloxicam, etc.) and BC powder. If you absolutely need Ibuprofen or BC powder for you headaches, would recommend taking over the counter Omeprazole to help protect your stomach. I would also recommend following up with PCP for additional recommendations to help treat your headaches.  - We stopped several medications (Wellbutrin, Cipro, and Doxepin) because you said you were no longer taking these.  Post NSTEMI: NO HEAVY LIFTING X 2 WEEKS. NO SEXUAL ACTIVITY X 2 WEEKS. NO DRIVING X 1 WEEK. NO SOAKING BATHS, HOT TUBS, POOLS, ETC., X 7 DAYS.  Radial Site Care: Refer to this sheet in the next few weeks. These instructions provide you with information on caring for yourself after your procedure. Your caregiver may also give you more specific instructions. Your treatment has been planned according to current medical practices, but problems sometimes occur. Call your caregiver if you have any problems or questions after your procedure. HOME CARE INSTRUCTIONS You may shower the day after the procedure. Remove the bandage (dressing) and gently wash the site with plain soap and water. Gently pat the site dry.  Do not apply powder or lotion to the site.  Do not submerge the affected site in water for 3 to 5 days.  Inspect the site at least twice daily.  Do not flex or bend the affected arm for 24 hours.  No lifting over 5 pounds (2.3 kg) for 5 days after your procedure.  Do not drive home if you are discharged the same day of the procedure. Have someone else drive you.  What to expect: Any  bruising will usually fade within 1 to 2 weeks.  Blood that collects in the tissue (hematoma) may be painful to the touch. It should usually decrease in size and tenderness within 1 to 2 weeks.  SEEK IMMEDIATE MEDICAL CARE IF: You have unusual pain at the radial site.  You have redness, warmth, swelling, or pain at the radial site.  You have drainage (other than a small amount of blood on the dressing).  You have chills.  You have a fever or persistent symptoms for more than 72 hours.  You have a fever and your symptoms suddenly get worse.  Your arm becomes pale, cool, tingly, or numb.  You have heavy bleeding from the site. Hold pressure on the site.

## 2022-07-08 NOTE — Progress Notes (Signed)
Mobility Specialist Progress Note:   07/08/22 1152  Mobility  Activity Ambulated with assistance in hallway  Level of Assistance Independent  Assistive Device None  Distance Ambulated (ft) 400 ft  Activity Response Tolerated well  $Mobility charge 1 Mobility   Pt sitting up in bed wiling to participate in mobility. No complaints of pain. Left EOB with call bell in reach and all needs met.   Anna Cameron [name] Mobility Specialist Please contact via [employer] or  Rehab Office at [phone]

## 2022-07-08 NOTE — Progress Notes (Signed)
Echocardiogram 2D Echocardiogram has been performed.  Oneal Deputy Amarie Viles RDCS 07/08/2022, 9:06 AM

## 2022-07-09 LAB — LIPOPROTEIN A (LPA): Lipoprotein (a): 42.8 nmol/L — ABNORMAL HIGH (ref <75.0)

## 2022-07-11 ENCOUNTER — Encounter: Payer: Self-pay | Admitting: Family Medicine

## 2022-07-11 ENCOUNTER — Telehealth: Payer: Self-pay

## 2022-07-11 ENCOUNTER — Ambulatory Visit (INDEPENDENT_AMBULATORY_CARE_PROVIDER_SITE_OTHER): Payer: 59 | Admitting: Family Medicine

## 2022-07-11 VITALS — BP 120/62 | HR 90 | Ht 67.0 in | Wt 199.0 lb

## 2022-07-11 DIAGNOSIS — I10 Essential (primary) hypertension: Secondary | ICD-10-CM | POA: Diagnosis not present

## 2022-07-11 DIAGNOSIS — E7801 Familial hypercholesterolemia: Secondary | ICD-10-CM | POA: Diagnosis not present

## 2022-07-11 DIAGNOSIS — D751 Secondary polycythemia: Secondary | ICD-10-CM

## 2022-07-11 DIAGNOSIS — F17210 Nicotine dependence, cigarettes, uncomplicated: Secondary | ICD-10-CM | POA: Diagnosis not present

## 2022-07-11 DIAGNOSIS — R7303 Prediabetes: Secondary | ICD-10-CM | POA: Diagnosis not present

## 2022-07-11 DIAGNOSIS — I214 Non-ST elevation (NSTEMI) myocardial infarction: Secondary | ICD-10-CM | POA: Diagnosis not present

## 2022-07-11 DIAGNOSIS — E876 Hypokalemia: Secondary | ICD-10-CM | POA: Diagnosis not present

## 2022-07-11 DIAGNOSIS — I2510 Atherosclerotic heart disease of native coronary artery without angina pectoris: Secondary | ICD-10-CM | POA: Diagnosis not present

## 2022-07-11 NOTE — Telephone Encounter (Signed)
error 

## 2022-07-11 NOTE — Telephone Encounter (Signed)
Not needed- someone is doing to [facility] call

## 2022-07-11 NOTE — Patient Instructions (Signed)
Why follow it? Research shows. Those who follow the Mediterranean diet have a reduced risk of heart disease  The diet is associated with a reduced incidence of Parkinson's and Alzheimer's diseases People following the diet may have longer life expectancies and lower rates of chronic diseases  The Dietary Guidelines for Americans recommends the Mediterranean diet as an eating plan to promote health and prevent disease  What Is the Mediterranean Diet?  Healthy eating plan based on typical foods and recipes of Mediterranean-style cooking The diet is primarily a plant based diet; these foods should make up a majority of meals   Starches - Plant based foods should make up a majority of meals - They are an important sources of vitamins, minerals, energy, antioxidants, and fiber - Choose whole grains, foods high in fiber and minimally processed items  - Typical grain sources include wheat, oats, barley, corn, brown rice, bulgar, farro, millet, polenta, couscous  - Various types of beans include chickpeas, lentils, fava beans, black beans, white beans   Fruits  Veggies - Large quantities of antioxidant rich fruits & veggies; 6 or more servings  - Vegetables can be eaten raw or lightly drizzled with oil and cooked  - Vegetables common to the traditional Mediterranean Diet include: artichokes, arugula, beets, broccoli, brussel sprouts, cabbage, carrots, celery, collard greens, cucumbers, eggplant, kale, leeks, lemons, lettuce, mushrooms, okra, onions, peas, peppers, potatoes, pumpkin, radishes, rutabaga, shallots, spinach, sweet potatoes, turnips, zucchini - Fruits common to the Mediterranean Diet include: apples, apricots, avocados, cherries, clementines, dates, figs, grapefruits, grapes, melons, nectarines, oranges, peaches, pears, pomegranates, strawberries, tangerines  Fats - Replace butter and margarine with healthy oils, such as olive oil, canola oil, and tahini  - Limit nuts to no more than a  handful a day  - Nuts include walnuts, almonds, pecans, pistachios, pine nuts  - Limit or avoid candied, honey roasted or heavily salted nuts - Olives are central to the [employer] - can be eaten whole or used in a variety of dishes   Meats Protein - Limiting red meat: no more than a few times a month - When eating red meat: choose lean cuts and keep the portion to the size of deck of cards - Eggs: approx. 0 to 4 times a week  - Fish and lean poultry: at least 2 a week  - Healthy protein sources include, chicken, [place], lean beef, lamb - Increase intake of seafood such as tuna, salmon, trout, mackerel, shrimp, scallops - Avoid or limit high fat processed meats such as sausage and bacon  Dairy - Include moderate amounts of low fat dairy products  - Focus on healthy dairy such as fat free yogurt, skim milk, low or reduced fat cheese - Limit dairy products higher in fat such as whole or 2% milk, cheese, ice cream  Alcohol - Moderate amounts of red wine is ok  - No more than 5 oz daily for women (all ages) and men older than age 56  - No more than 10 oz of wine daily for men younger than 36  Other - Limit sweets and other desserts  - Use herbs and spices instead of salt to flavor foods  - Herbs and spices common to the traditional Mediterranean Diet include: basil, bay leaves, chives, cloves, cumin, fennel, garlic, lavender, marjoram, mint, oregano, parsley, pepper, rosemary, sage, savory, sumac, tarragon, thyme   It's not just a diet, it's a lifestyle:  The Mediterranean diet includes lifestyle factors typical of those in the  region  Foods, drinks and meals are best eaten with others and savored Daily physical activity is important for overall good health This could be strenuous exercise like running and aerobics This could also be more leisurely activities such as walking, housework, yard-work, or taking the stairs Moderation is the key; a balanced and healthy diet accommodates most  foods and drinks Consider portion sizes and frequency of consumption of certain foods   Meal Ideas & Options:  Breakfast:  Whole wheat toast or whole wheat English muffins with peanut butter & hard boiled egg Steel cut oats topped with apples & cinnamon and skim milk  Fresh fruit: banana, strawberries, melon, berries, peaches  Smoothies: strawberries, bananas, greek yogurt, peanut butter Low fat greek yogurt with blueberries and granola  Egg white omelet with spinach and mushrooms Breakfast couscous: whole wheat couscous, apricots, skim milk, cranberries  Sandwiches:  Hummus and grilled vegetables (peppers, zucchini, squash) on whole wheat bread   Grilled chicken on whole wheat pita with lettuce, tomatoes, cucumbers or tzatziki  [place] salad on whole wheat bread: tuna salad made with greek yogurt, olives, red peppers, capers, green onions Garlic rosemary lamb pita: lamb sauted with garlic, rosemary, salt & pepper; add lettuce, cucumber, greek yogurt to pita - flavor with lemon juice and black pepper  Seafood:  Mediterranean grilled salmon, seasoned with garlic, basil, parsley, lemon juice and black pepper Shrimp, lemon, and spinach whole-grain pasta salad made with low fat greek yogurt  Seared scallops with lemon orzo  Seared tuna steaks seasoned salt, pepper, coriander topped with tomato mixture of olives, tomatoes, olive oil, minced garlic, parsley, green onions and cappers  Meats:  Herbed greek chicken salad with kalamata olives, cucumber, feta  Red bell peppers stuffed with spinach, bulgur, lean ground beef (or lentils) & topped with feta   Kebabs: skewers of chicken, tomatoes, onions, zucchini, squash  [place] burgers: made with red onions, mint, dill, lemon juice, feta cheese topped with roasted red peppers Vegetarian Cucumber salad: cucumbers, artichoke hearts, celery, red onion, feta cheese, tossed in olive oil & lemon juice  Hummus and whole grain pita points with a greek salad  (lettuce, tomato, feta, olives, cucumbers, red onion) Lentil soup with celery, carrots made with vegetable broth, garlic, salt and pepper  Tabouli salad: parsley, bulgur, mint, scallions, cucumbers, tomato, radishes, lemon juice, olive oil, salt and pepper.

## 2022-07-11 NOTE — Transitions of Care (Post Inpatient/ED Visit) (Signed)
   07/11/2022  Name: Anna Cameron MRN: [id number] DOB: 09/10/1975  Today's TOC FU Call Status: Today's TOC FU Call Status:: Successful TOC FU Call Competed TOC FU Call Complete Date: 07/11/22  Transition Care Management Follow-up Telephone Call Date of Discharge: 07/08/22 Discharge Facility: [facility]) Type of Discharge: Inpatient Admission Primary Inpatient Discharge Diagnosis:: coronary angioplasty How have you been since you were released from the hospital?: Better Any questions or concerns?: No  Items Reviewed: Did you receive and understand the discharge instructions provided?: Yes Medications obtained and verified?: Yes (Medications Reviewed) Any new allergies since your discharge?: No Dietary orders reviewed?: Yes Do you have support at home?: Yes People in Home: spouse  Home Care and Equipment/Supplies: [place] Ordered?: NA Any new equipment or medical supplies ordered?: NA  Functional Questionnaire: Do you need assistance with bathing/showering or dressing?: No Do you need assistance with meal preparation?: No Do you need assistance with eating?: No Do you have difficulty maintaining continence: No Do you need assistance with getting out of bed/getting out of a chair/moving?: No Do you have difficulty managing or taking your medications?: No  Folllow up appointments reviewed: PCP Follow-up appointment confirmed?: Yes Date of PCP follow-up appointment?: 07/11/22 Specialist Hospital Follow-up appointment confirmed?: Yes Date of Specialist follow-up appointment?: 07/27/22 Follow-Up Specialty Provider:: Dr Sarajane Jews Do you need transportation to your follow-up appointment?: No Do you understand care options if your condition(s) worsen?: Yes-patient verbalized understanding    [place], [place] Nurse Health Advisor Direct Dial [phone]

## 2022-07-11 NOTE — Progress Notes (Unsigned)
Date:  07/11/2022   Name:  Anna Cameron   DOB:  December 19, 1975   MRN:  [id number]   Chief Complaint: Hospitalization Follow-up [facility] in stay on 07/06/22 for heart attack. Discharged on 07/08/2022 and  TOC call placed today)  Follow up Hospitalization  Patient was admitted to [facility] on 2/14 and discharged on 2/16. She was treated for NSTEMI. Treatment for this included stent. Telephone follow up was done on 07/11/22 She reports good compliance with treatment. She reports this condition is improved.  ----------------------------------------------------------------------------------------- -     Heart Problem This is a new problem. The current episode started in the past 7 days. Associated symptoms include chest pain, diaphoresis and fatigue. Pertinent negatives include no coughing or nausea. Associated symptoms comments: Radiated to back/Dyspna  All symptoms have since resolved s/p stent.  [facility] Discharge Acute Issues Care Follow Up                                                                        Patient Demographics  Anna Cameron, is a 47 y.o. female  DOB Jun 01, 1975  MRN [id number].  Primary MD  Juline Patch, MD   Reason for TCC follow Up - f/u NSTEMI/hypokalemia/erythrocytosis/hyperlipidemia/hypertension   Past Medical History:  Diagnosis Date   Allergy    Anxiety    Depression    [name]    Tobacco abuse     Past Surgical History:  Procedure Laterality Date   ABDOMINAL HYSTERECTOMY     bladder tact     BOTOX INJECTION     CESAREAN SECTION     x 3   CORONARY STENT INTERVENTION N/A 07/07/2022   Procedure: CORONARY STENT INTERVENTION;  Surgeon: Early Osmond, MD;  Location: [place] [facility];  Service: Cardiovascular;  Laterality: N/A;   CYSTOSCOPY WITH INJECTION N/A  09/05/2019   Procedure: CYSTOSCOPY WITH INJECTION BLADDER BOTOX;  Surgeon: Royston Cowper, MD;  Location: [facility];  Service: Urology;  Laterality: N/A;   LEFT HEART CATH AND CORONARY ANGIOGRAPHY N/A 07/07/2022   Procedure: LEFT HEART CATH AND CORONARY ANGIOGRAPHY;  Surgeon: Early Osmond, MD;  Location: [place] [facility];  Service: Cardiovascular;  Laterality: N/A;   HPI Hospital course catherization/stent first diagonal lesion 95% Post care hyperkalemia/erythrocytosis/hyperlipidemia/hypertension              Subjective:   Wyomia Malloch today has, No headache, No chest pain, No abdominal pain - No Nausea, No new weakness tingling or numbness, No Cough - SOB. No chest pain,no dyspnea  Assessment & Plan   See below   Reason for frequent admissions/ER visits  none currently stable      Objective:   Vitals:   07/11/22 1630  BP: 120/62  Pulse: 90  SpO2: 96%  Weight: 199 lb (90.3 kg)  Height: 5' 7"$  (1.702 m)    Wt Readings from Last 3  Encounters:  07/11/22 199 lb (90.3 kg)  07/07/22 196 lb 10.4 oz (89.2 kg)  12/13/21 192 lb (87.1 kg)    Allergies as of 07/11/2022   No Known Allergies      Medication List        Accurate as of July 11, 2022  5:00 PM. If you have any questions, ask your nurse or doctor.          Aspirin Low Dose 81 MG chewable tablet Generic drug: aspirin Chew 1 tablet (81 mg total) by mouth daily.   atorvastatin 80 MG tablet Commonly known as: LIPITOR Take 1 tablet (80 mg total) by mouth at bedtime.   Brilinta 90 MG Tabs tablet Generic drug: ticagrelor Take 1 tablet (90 mg total) by mouth 2 (two) times daily.   cyanocobalamin 1000 MCG tablet Commonly known as: VITAMIN B12 Take 1,000 mcg by mouth in the morning.   fexofenadine 180 MG tablet Commonly known as: ALLEGRA Take 180 mg by mouth in the morning.   Gemtesa 75 MG Tabs Generic drug: Vibegron Take 1 tablet (75 mg total) by mouth daily. What changed: when to  take this   metoprolol tartrate 25 MG tablet Commonly known as: LOPRESSOR Take 1/2 tablet (12.5 mg total) by mouth 2 (two) times daily.   multivitamin with minerals Tabs tablet Take 1 tablet by mouth daily with breakfast.   nicotine 21 mg/24hr patch Commonly known as: NICODERM CQ - dosed in mg/24 hours Place 1 patch (21 mg total) onto the skin daily.   nitroGLYCERIN 0.4 MG SL tablet Commonly known as: NITROSTAT Place 1 tablet (0.4 mg total) under the tongue every 5 (five) minutes as needed for chest pain.         Physical Exam: Constitutional: Patient appears well-developed and well-nourished. Not in obvious distress. HENT: Normocephalic, atraumatic, External right and left ear normal. Oropharynx is clear and moist.  Eyes: Conjunctivae and EOM are normal. PERRLA, no scleral icterus. Neck: Normal ROM. Neck supple. No JVD. No tracheal deviation. No thyromegaly. CVS: RRR, S1/S2 +, no murmurs, no gallops, no carotid bruit.  Pulmonary: Effort and breath sounds normal, no stridor, rhonchi, wheezes, rales.  Abdominal: Soft. BS +, no distension, tenderness, rebound or guarding.  Musculoskeletal: Normal range of motion. No edema and no tenderness.  Lymphadenopathy: No lymphadenopathy noted, cervical, inguinal or axillary Neuro: Alert. Normal reflexes, muscle tone coordination. No cranial nerve deficit. Skin: Skin is warm and dry. No rash noted. Not diaphoretic. No erythema. No pallor. Psychiatric: Normal mood and affect. Behavior, judgment, thought content normal.   Data Review   Micro Results No results found for this or any previous visit (from the past 240 hour(s)).   CBC Recent Labs  Lab 07/06/22 1217 07/07/22 0036 07/07/22 1904 07/08/22 0013  WBC 8.9 8.3 9.3 10.5  HGB 15.8* 15.2* 15.9* 15.6*  HCT 47.2* 43.5 44.5 43.9  PLT 301 281 305 283  MCV 95.2 92.8 90.4 90.9  MCH 31.9 32.4 32.3 32.3  MCHC 33.5 34.9 35.7 35.5  RDW 12.6 12.6 12.3 12.3  LYMPHSABS  --  2.7  --    --   MONOABS  --  0.7  --   --   EOSABS  --  0.3  --   --   BASOSABS  --  0.1  --   --     Chemistries  Recent Labs  Lab 07/06/22 1217 07/07/22 0036 07/08/22 0013  NA 143 140 139  K 3.6 3.4* 3.6  CL 108  106 108  CO2 25 25 23  $ GLUCOSE 103* 86 86  BUN 17 15 15  $ CREATININE 0.80 0.72 0.72  CALCIUM 8.6* 9.4 9.0   ------------------------------------------------------------------------------------------------------------------ estimated creatinine clearance is 101.4 mL/min (by C-G formula based on SCr of 0.72 mg/dL). ------------------------------------------------------------------------------------------------------------------ No results for input(s): "HGBA1C" in the last 72 hours. ------------------------------------------------------------------------------------------------------------------ No results for input(s): "CHOL", "HDL", "LDLCALC", "TRIG", "CHOLHDL", "LDLDIRECT" in the last 72 hours. ------------------------------------------------------------------------------------------------------------------ No results for input(s): "TSH", "T4TOTAL", "T3FREE", "THYROIDAB" in the last 72 hours.  Invalid input(s): "FREET3" ------------------------------------------------------------------------------------------------------------------ No results for input(s): "VITAMINB12", "FOLATE", "FERRITIN", "TIBC", "IRON", "RETICCTPCT" in the last 72 hours.  Coagulation profile Recent Labs  Lab 07/06/22 1355  INR 0.9    No results for input(s): "DDIMER" in the last 72 hours.  Cardiac Enzymes No results for input(s): "CKMB", "TROPONINI", "MYOGLOBIN" in the last 168 hours.  Invalid input(s): "CK" ------------------------------------------------------------------------------------------------------------------ Invalid input(s): "POCBNP" Time spent in Minutes 40     Otilio Miu M.D on 07/11/2022 at 5:00 PM   Disclaimer: This note may have been dictated with voice recognition  software. Similar sounding words can inadvertently be transcribed and this note may contain transcription errors which may not have been corrected upon publication of note.    Lab Results  Component Value Date   NA 139 07/08/2022   K 3.6 07/08/2022   CO2 23 07/08/2022   GLUCOSE 86 07/08/2022   BUN 15 07/08/2022   CREATININE 0.72 07/08/2022   CALCIUM 9.0 07/08/2022   GFRNONAA >60 07/08/2022   Lab Results  Component Value Date   CHOL 258 (H) 07/06/2022   HDL 41 07/06/2022   LDLCALC 180 (H) 07/06/2022   TRIG 184 (H) 07/06/2022   CHOLHDL 6.3 07/06/2022   Lab Results  Component Value Date   TSH 2.210 01/24/2020   Lab Results  Component Value Date   HGBA1C 5.6 07/06/2022   Lab Results  Component Value Date   WBC 10.5 07/08/2022   HGB 15.6 (H) 07/08/2022   HCT 43.9 07/08/2022   MCV 90.9 07/08/2022   PLT 283 07/08/2022   Lab Results  Component Value Date   ALT 20 01/24/2020   AST 21 01/24/2020   ALKPHOS 71 01/24/2020   BILITOT <0.2 01/24/2020   No results found for: "25OHVITD2", "25OHVITD3", "VD25OH"   Review of Systems  Constitutional:  Positive for diaphoresis and fatigue.  Respiratory:  Negative for cough, chest tightness and shortness of breath.   Cardiovascular:  Positive for chest pain. Negative for palpitations and leg swelling.  Gastrointestinal:  Negative for nausea.    Patient Active Problem List   Diagnosis Date Noted   CAD (coronary artery disease) 07/08/2022   Elevated BP without diagnosis of hypertension 07/08/2022   Hyperlipidemia 07/08/2022   NSTEMI (non-ST elevated myocardial infarction) ([place]) 07/06/2022   MDD (major depressive disorder), recurrent episode, mild ([place]) 09/14/2021   Insomnia 09/14/2021   Tobacco abuse 09/14/2021   Alcohol use disorder, moderate, in sustained remission ([place]) 09/14/2021   H/O total hysterectomy 07/14/2020   [name] 02/19/2020   Weight loss, unintentional 09/22/2016    No Known Allergies  Past Surgical  History:  Procedure Laterality Date   ABDOMINAL HYSTERECTOMY     bladder tact     BOTOX INJECTION     CESAREAN SECTION     x 3   CORONARY STENT INTERVENTION N/A 07/07/2022   Procedure: CORONARY STENT INTERVENTION;  Surgeon: Early Osmond, MD;  Location: [place] [facility];  Service: Cardiovascular;  Laterality: N/A;  CYSTOSCOPY WITH INJECTION N/A 09/05/2019   Procedure: CYSTOSCOPY WITH INJECTION BLADDER BOTOX;  Surgeon: Royston Cowper, MD;  Location: [facility];  Service: Urology;  Laterality: N/A;   LEFT HEART CATH AND CORONARY ANGIOGRAPHY N/A 07/07/2022   Procedure: LEFT HEART CATH AND CORONARY ANGIOGRAPHY;  Surgeon: Early Osmond, MD;  Location: [place] [facility];  Service: Cardiovascular;  Laterality: N/A;    Social History   Tobacco Use   Smoking status: Every Day    Packs/day: 1.50    Years: 29.00    Total pack years: 43.50    Types: Cigarettes   Smokeless tobacco: Never  Vaping Use   Vaping Use: Never used  Substance Use Topics   Alcohol use: Not Currently   Drug use: Never     Medication list has been reviewed and updated.  Current Meds  Medication Sig   aspirin 81 MG chewable tablet Chew 1 tablet (81 mg total) by mouth daily.   atorvastatin (LIPITOR) 80 MG tablet Take 1 tablet (80 mg total) by mouth at bedtime.   fexofenadine (ALLEGRA) 180 MG tablet Take 180 mg by mouth in the morning.   metoprolol tartrate (LOPRESSOR) 25 MG tablet Take 1/2 tablet (12.5 mg total) by mouth 2 (two) times daily.   Multiple Vitamin (MULTIVITAMIN WITH MINERALS) TABS tablet Take 1 tablet by mouth daily with breakfast.   nicotine (NICODERM CQ - DOSED IN MG/24 HOURS) 21 mg/24hr patch Place 1 patch (21 mg total) onto the skin daily.   nitroGLYCERIN (NITROSTAT) 0.4 MG SL tablet Place 1 tablet (0.4 mg total) under the tongue every 5 (five) minutes as needed for chest pain.   ticagrelor (BRILINTA) 90 MG TABS tablet Take 1 tablet (90 mg total) by mouth 2 (two) times daily.   Vibegron  (GEMTESA) 75 MG TABS Take 1 tablet (75 mg total) by mouth daily. (Patient taking differently: Take 75 mg by mouth every evening.)   vitamin B-12 (CYANOCOBALAMIN) 1000 MCG tablet Take 1,000 mcg by mouth in the morning.       07/11/2022    4:33 PM 09/14/2021   11:25 AM 05/18/2021    3:00 PM 03/09/2021   11:38 AM  GAD 7 : Generalized Anxiety Score  Nervous, Anxious, on Edge 0  0 0  Control/stop worrying 0  0 0  Worry too much - different things 0  0 2  Trouble relaxing 0  1 2  Restless 0  1 0  Easily annoyed or irritable 0  3 1  Afraid - awful might happen 0  0 0  Total GAD 7 Score 0  5 5  Anxiety Difficulty Not difficult at all  Not difficult at all Not difficult at all     Information is confidential and restricted. Go to Review Flowsheets to unlock data.       07/11/2022    4:33 PM 09/14/2021   11:01 AM 05/18/2021    2:59 PM  Depression screen PHQ 2/9  Decreased Interest 0  1  Down, Depressed, Hopeless 0  1  PHQ - 2 Score 0  2  Altered sleeping 0  3  Tired, decreased energy 0  3  Change in appetite 0  1  Feeling bad or failure about yourself  0  0  Trouble concentrating 0  2  Moving slowly or fidgety/restless 0  2  Suicidal thoughts 0  0  PHQ-9 Score 0  13  Difficult doing work/chores Not difficult at all  Not difficult at all  Information is confidential and restricted. Go to Review Flowsheets to unlock data.    BP Readings from Last 3 Encounters:  07/11/22 120/62  07/08/22 117/77  12/13/21 127/83    Physical Exam HENT:     Right Ear: Tympanic membrane, ear canal and external ear normal. There is no impacted cerumen.     Left Ear: Tympanic membrane, ear canal and external ear normal. There is no impacted cerumen.     Nose: No congestion or rhinorrhea.     Mouth/Throat:     Mouth: Mucous membranes are moist.     Pharynx: No oropharyngeal exudate or posterior oropharyngeal erythema.  Eyes:     General: No scleral icterus.       Right eye: No discharge.         Left eye: No discharge.     Extraocular Movements: Extraocular movements intact.     Pupils: Pupils are equal, round, and reactive to light.  Cardiovascular:     Rate and Rhythm: Normal rate and regular rhythm.     Heart sounds: No murmur heard.    No friction rub. No gallop.  Pulmonary:     Effort: No respiratory distress.     Breath sounds: No stridor. No wheezing, rhonchi or rales.  Chest:     Chest wall: No tenderness.  Abdominal:     General: There is no distension.     Palpations: There is no mass.     Tenderness: There is no guarding or rebound.  Musculoskeletal:        General: No swelling.     Wt Readings from Last 3 Encounters:  07/11/22 199 lb (90.3 kg)  07/07/22 196 lb 10.4 oz (89.2 kg)  12/13/21 192 lb (87.1 kg)    BP 120/62   Pulse 90   Ht 5' 7"$  (1.702 m)   Wt 199 lb (90.3 kg)   SpO2 96%   BMI 31.17 kg/m   Assessment and Plan:  1. NSTEMI (non-ST elevated myocardial infarction) ([place]) Status post admission for non-STEMI with normal EKG but elevated troponin to 400.  Patient underwent catheterization in which there was a 95% occlusion of a portion of the diagonal which was stented.  2. Coronary artery disease involving native coronary artery of native heart without angina pectoris Patient has coronary artery disease which is being treated with dual antiplatelet/antithrombogenic therapy.  Patient is asymptomatic at this time and is doing well.  Patient will continue Brilinta 90 mg twice a day and aspirin 81 mg once a day.  3. Primary hypertension Chronic.  Controlled.  Stable.  Blood pressure today is 120/62 continue metoprolol 25 mg twice a day.    4. [name] 2 courses of therapy of which is self driven is contributing to this which is patient has continued to donate plasma as well as continue to smoke.  Both of these are contributing to her erythrocytosis and we will repeat CBC for current status later in the week. - CBC with  Differential/Platelet  5. Hypokalemia Patient had noted hypokalemia and is not on potassium supplementation at this time but we will recheck renal function panel on Wednesday and determine if this is ongoing will need to be dealt with. - Renal Function Panel  6. Cigarette nicotine dependence without complication Patient has been advised of the health risks of smoking and counseled concerning cessation of tobacco products. I spent over 3 minutes for discussion and to answer questions.   7. Familial hypercholesterolemia Chronic.  Controlled.  Stable.  Patient recently initiated on atorvastatin 80 mg once a day.  And will check lipid panel on Wednesday. - Lipid Panel With LDL/HDL Ratio  8. Prediabetes Patient had a A1c of 5.6 in I have prescribed a Mediterranean diet which I think will have a better effect in terms of dealing with her CAD/hyperlipidemia/hypertension/in diabetic tendency.   Otilio Miu, MD

## 2022-07-12 ENCOUNTER — Encounter: Payer: Self-pay | Admitting: Family Medicine

## 2022-07-13 DIAGNOSIS — E7801 Familial hypercholesterolemia: Secondary | ICD-10-CM | POA: Diagnosis not present

## 2022-07-13 DIAGNOSIS — E876 Hypokalemia: Secondary | ICD-10-CM | POA: Diagnosis not present

## 2022-07-13 DIAGNOSIS — D751 Secondary polycythemia: Secondary | ICD-10-CM | POA: Diagnosis not present

## 2022-07-14 LAB — LIPID PANEL WITH LDL/HDL RATIO
Cholesterol, Total: 183 mg/dL (ref 100–199)
HDL: 38 mg/dL — ABNORMAL LOW (ref >39)
LDL Chol Calc (NIH): 118 mg/dL — ABNORMAL HIGH (ref 0–99)
LDL/HDL Ratio: 3.1 ratio (ref 0.0–3.2)
Triglycerides: 151 mg/dL — ABNORMAL HIGH (ref 0–149)
VLDL Cholesterol Cal: 27 mg/dL (ref 5–40)

## 2022-07-14 LAB — RENAL FUNCTION PANEL
Albumin: 4.5 g/dL (ref 3.9–4.9)
BUN/Creatinine Ratio: 21 (ref 9–23)
BUN: 18 mg/dL (ref 6–24)
CO2: 23 mmol/L (ref 20–29)
Calcium: 9.9 mg/dL (ref 8.7–10.2)
Chloride: 103 mmol/L (ref 96–106)
Creatinine, Ser: 0.84 mg/dL (ref 0.57–1.00)
Glucose: 98 mg/dL (ref 70–99)
Phosphorus: 3.3 mg/dL (ref 3.0–4.3)
Potassium: 4.6 mmol/L (ref 3.5–5.2)
Sodium: 141 mmol/L (ref 134–144)
eGFR: 87 mL/min/{1.73_m2} (ref >59)

## 2022-07-14 LAB — CBC WITH DIFFERENTIAL/PLATELET
Basophils Absolute: 0.1 10*3/uL (ref 0.0–0.2)
Basos: 1 %
EOS (ABSOLUTE): 0.2 10*3/uL (ref 0.0–0.4)
Eos: 3 %
Hematocrit: 50.4 % — ABNORMAL HIGH (ref 34.0–46.6)
Hemoglobin: 17 g/dL — ABNORMAL HIGH (ref 11.1–15.9)
Immature Grans (Abs): 0 10*3/uL (ref 0.0–0.1)
Immature Granulocytes: 0 %
Lymphocytes Absolute: 1.5 10*3/uL (ref 0.7–3.1)
Lymphs: 19 %
MCH: 31.7 pg (ref 26.6–33.0)
MCHC: 33.7 g/dL (ref 31.5–35.7)
MCV: 94 fL (ref 79–97)
Monocytes Absolute: 0.7 10*3/uL (ref 0.1–0.9)
Monocytes: 9 %
Neutrophils Absolute: 5.1 10*3/uL (ref 1.4–7.0)
Neutrophils: 68 %
Platelets: 337 10*3/uL (ref 150–450)
RBC: 5.36 x10E6/uL — ABNORMAL HIGH (ref 3.77–5.28)
RDW: 12.2 % (ref 11.7–15.4)
WBC: 7.5 10*3/uL (ref 3.4–10.8)

## 2022-07-15 ENCOUNTER — Encounter: Payer: Self-pay | Admitting: Urology

## 2022-07-15 ENCOUNTER — Other Ambulatory Visit (HOSPITAL_COMMUNITY): Payer: Self-pay

## 2022-07-15 MED ORDER — NICOTINE POLACRILEX 4 MG MT LOZG
LOZENGE | OROMUCOSAL | 0 refills | Status: DC
  Filled 2022-07-15: qty 72, 42d supply, fill #0

## 2022-07-15 NOTE — Telephone Encounter (Signed)
Sent prior auth request to [facility] today for [place], Spoke with patient and advised results

## 2022-07-16 NOTE — Progress Notes (Signed)
Cardiology Office Note:    Date:  07/27/2022   ID:  Lauree Chandler, DOB 1975-05-29, MRN [id number]  PCP:  Juline Patch, MD  Cardiologist:  [name], MD  Electrophysiologist:  None   Referring MD: Juline Patch, MD   Chief Complaint: hospital follow-up of NSTEMI  History of Present Illness:    Anna Cameron is a 47 y.o. female with a history of CAD with recent NSTEMI on 07/06/2022 s/p DES to 1st Diag, hyperlipidemia, erythrocytosis, anxiety, prior ETOH abuse, and tobacco abuse who is followed by Dr. Stanford Breed and presents today for hospital follow-up of NSTEMI.  Patient was first seen by Cardiology during recent hospitalization. She was admitted from 07/06/2022 to 07/08/2022 for NSTEMI after presenting with intermittent chest heaviness. High-sensitivity troponin peaked at 401. LHC showed 95% stenosis of the 1st Diag and otherwise normal coronaries. She underwent successful PCI with DES to the 1st Diag. Echo showed LVEF of 60-65%, no regional wall motion abnormalities, grade 1 diastolic dysfunction, normal RV, and mild to moderate TR. She was started on DAPT with Aspirin and Brilinta as well as a beta-blocker and high-intensity statin.  Patient presents today for follow-up. Here alone. She is doing well since recent discharge. She denies any recurrent chest pain. No shortness of breath, orthopnea, PND, lower extremity edema, palpitations, lightheadedness, dizziness, or syncope. Her only complaint is feeling very fatigued. She states this is not entirely new but is much worse since being started on new medications. She is otherwise tolerating the medications well. No abnormal bleeding on DAPT.  Past Medical History:  Diagnosis Date   Allergy    Anxiety    CAD (coronary artery disease)    Depression    [name]    Hyperlipidemia    NSTEMI (non-ST elevated myocardial infarction) ([place])    s/p DES to 1st Diag in 06/2022   Tobacco abuse     Past Surgical History:  Procedure  Laterality Date   ABDOMINAL HYSTERECTOMY     bladder tact     BOTOX INJECTION     CESAREAN SECTION     x 3   CORONARY STENT INTERVENTION N/A 07/07/2022   Procedure: CORONARY STENT INTERVENTION;  Surgeon: Early Osmond, MD;  Location: [facility];  Service: Cardiovascular;  Laterality: N/A;   CYSTOSCOPY WITH INJECTION N/A 09/05/2019   Procedure: CYSTOSCOPY WITH INJECTION BLADDER BOTOX;  Surgeon: Royston Cowper, MD;  Location: [facility];  Service: Urology;  Laterality: N/A;   LEFT HEART CATH AND CORONARY ANGIOGRAPHY N/A 07/07/2022   Procedure: LEFT HEART CATH AND CORONARY ANGIOGRAPHY;  Surgeon: Early Osmond, MD;  Location: [facility];  Service: Cardiovascular;  Laterality: N/A;    Current Medications: Current Meds  Medication Sig   aspirin 81 MG chewable tablet Chew 1 tablet (81 mg total) by mouth daily.   atorvastatin (LIPITOR) 80 MG tablet Take 1 tablet (80 mg total) by mouth at bedtime.   fexofenadine (ALLEGRA) 180 MG tablet Take 180 mg by mouth in the morning.   Multiple Vitamin (MULTIVITAMIN WITH MINERALS) TABS tablet Take 1 tablet by mouth daily with breakfast.   nicotine polacrilex (COMMIT) 4 MG lozenge Use as directed   nitroGLYCERIN (NITROSTAT) 0.4 MG SL tablet Place 1 tablet (0.4 mg total) under the tongue every 5 (five) minutes as needed for chest pain.   ticagrelor (BRILINTA) 90 MG TABS tablet Take 1 tablet (90 mg total) by mouth 2 (two) times daily.   Vibegron [facility]  75 MG TABS Take 1 tablet (75 mg total) by mouth daily. (Patient taking differently: Take 75 mg by mouth every evening.)   vitamin B-12 (CYANOCOBALAMIN) 1000 MCG tablet Take 1,000 mcg by mouth in the morning.   [DISCONTINUED] metoprolol tartrate (LOPRESSOR) 25 MG tablet Take 1/2 tablet (12.5 mg total) by mouth 2 (two) times daily.   [DISCONTINUED] nicotine (NICODERM CQ - DOSED IN MG/24 HOURS) 21 mg/24hr patch Place 1 patch (21 mg total) onto the skin daily.     Allergies:   Patient has no  known allergies.   Social History   Socioeconomic History   Marital status: Married    Spouse name: Not on file   Number of children: 5   Years of education: ged   Highest education level: Not on file  Occupational History   Not on file  Tobacco Use   Smoking status: Every Day    Packs/day: 1.50    Years: 29.00    Total pack years: 43.50    Types: Cigarettes   Smokeless tobacco: Never  Vaping Use   Vaping Use: Never used  Substance and Sexual Activity   Alcohol use: Not Currently   Drug use: Never   Sexual activity: Not Currently  Other Topics Concern   Not on file  Social History Narrative   Not on file   Social Determinants of Health   Financial Resource Strain: Not on file  Food Insecurity: No Food Insecurity (07/07/2022)   Hunger Vital Sign    Worried About Running Out of Food in the Last Year: Never true    Ran Out of Food in the Last Year: Never true  Transportation Needs: No Transportation Needs (07/07/2022)   PRAPARE - Hydrologist (Medical): No    Lack of Transportation (Non-Medical): No  Physical Activity: Not on file  Stress: Not on file  Social Connections: Not on file     Family History: The patient's family history includes Cancer in her father and maternal grandmother; Schizophrenia in her brother.  ROS:   Please see the history of present illness.     EKGs/Labs/Other Studies Reviewed:    The following studies were reviewed:  Left Cardiac Catheterization 07/07/2022:   1st Diag lesion is 95% stenosed.   A stent was successfully placed.   Post intervention, there is a 0% residual stenosis.   1.  High-grade first diagonal lesion treated with 1 drug-eluting stent. 2.  LVEDP of 14 mmHg.   Recommendation: Dual antiplatelet therapy for 1 year and optimal medical therapy for cardiovascular risk factors.   Diagnostic Dominance: Right  Intervention        _____________   Echocardiogram  07/08/2022: Impressions: 1. Left ventricular ejection fraction, by estimation, is 60 to 65%. The  left ventricle has normal function. The left ventricle has no regional  wall motion abnormalities. Left ventricular diastolic parameters are  consistent with Grade I diastolic  dysfunction (impaired relaxation).   2. Right ventricular systolic function is normal. The right ventricular  size is normal. There is normal pulmonary artery systolic pressure. The  estimated right ventricular systolic pressure is 99991111 mmHg.   3. Left atrial size was mildly dilated.   4. The mitral valve is normal in structure. No evidence of mitral valve  regurgitation. No evidence of mitral stenosis.   5. Tricuspid valve regurgitation is mild to moderate.   6. The aortic valve is normal in structure. Aortic valve regurgitation is  not visualized. No aortic  stenosis is present.   7. The inferior vena cava is normal in size with greater than 50%  respiratory variability, suggesting right atrial pressure of 3 mmHg.   EKG:  EKG ordered today. EKG personally reviewed and demonstrates normal sinus rhythm, rate 75 bpm, with T wave inversions in leads aVL and otherwise non-specific T wave changes. Normal axis. Normal PR and QRS interval. QTc 469 ms. No significant changes compared to prior tracing.   Recent Labs: 07/13/2022: BUN 18; Creatinine, Ser 0.84; Hemoglobin 17.0; Platelets 337; Potassium 4.6; Sodium 141  Recent Lipid Panel    Component Value Date/Time   CHOL 183 07/13/2022 0922   TRIG 151 (H) 07/13/2022 0922   HDL 38 (L) 07/13/2022 0922   CHOLHDL 6.3 07/06/2022 1355   VLDL 37 07/06/2022 1355   LDLCALC 118 (H) 07/13/2022 0922    Physical Exam:    Vital Signs: BP 102/68 (BP Location: Left Arm, Patient Position: Sitting, Cuff Size: Normal)   Pulse 75   Ht '5\' 7"'$  (1.702 m)   Wt 196 lb 9.6 oz (89.2 kg)   SpO2 96%   BMI 30.79 kg/m     Wt Readings from Last 3 Encounters:  07/27/22 196 lb 9.6 oz (89.2 kg)   07/11/22 199 lb (90.3 kg)  07/07/22 196 lb 10.4 oz (89.2 kg)     General: 47 y.o. Caucasian female in no acute distress. HEENT: Normocephalic and atraumatic. Sclera clear.  Neck: Supple. No carotid bruits. No JVD. Heart: RRR. Distinct S1 and S2. No murmurs, gallops, or rubs. Radial pulses 2+ and equal bilaterally. Right radial cath site soft with no signs of hematoma.  Lungs: No increased work of breathing. Clear to ausculation bilaterally. No wheezes, rhonchi, or rales.  Abdomen: Soft, non-distended, and non-tender to palpation.  Extremities: No lower extremity edema.    Skin: Warm and dry. Neuro: Alert and oriented x3. No focal deficits. Psych: Normal affect. Responds appropriately.  Assessment:    1. Coronary artery disease involving native coronary artery of native heart without angina pectoris   2. History of non-ST elevation myocardial infarction (NSTEMI)   3. Hyperlipidemia, unspecified hyperlipidemia type   4. [name]   5. Tobacco abuse     Plan:    CAD with Recent NSTEMI Recently admitted in 06/2022 with NSTEMI and underwent DES to 1st Diag.  - Doing well. No recurrent chest pain.  - Continue DAPT with Aspirin '81mg'$  daily and Brilinta '90mg'$  twice daily. - Continue Lipitor '80mg'$  daily. - Will stop Lopressor due to significant fatigue. Will hold off on trying any other beta-blocker due to soft BP. - OK to start Cardiac Rehab.  Hyperlipidemia Lipid panel on 07/13/2022: Total Cholesterol 183, Triglycerides  151, HDL 38, LDL 118. LDL goal <70 given CAD. - Started on Lipitor '80mg'$  daily during recent admission. Continue. - Will repeat lipid panel and LFTs in 6-8 weeks.  [name]  Patient has a history of erythrocytosis that was previously treated with phlebotomy. Most recent hemoglobin 17.0 at PCP's office on 07/13/2022. - Educated on the importance of complete smoking cessation. - PCP is following labs closely.   Tobacco Abuse She continues to smoke but has  cut down from 1.5 packs per day to 1/2 pack per day.  - Congratulated patient on progress made so far and encouraged her to continue to work towards complete cessation.  - Will provide refill of Nicotine patches.   Disposition: Follow up in 3 months.    Medication Adjustments/Labs and Tests Ordered: Current medicines  are reviewed at length with the patient today.  Concerns regarding medicines are outlined above.  Orders Placed This Encounter  Procedures   Lipid panel   Hepatic function panel   EKG 12-Lead   Meds ordered this encounter  Medications   nicotine (NICODERM CQ - DOSED IN MG/24 HOURS) 21 mg/24hr patch    Sig: Place 1 patch (21 mg total) onto the skin daily.    Dispense:  28 patch    Refill:  0    Patient Instructions  Medication Instructions:   STOP Metoprolol Tartrate   *If you need a refill on your cardiac medications before your next appointment, please call your pharmacy*  Lab Work: Your physician recommends that you return for lab work in 2 months:  Fasting Lipid Panel-DO NOT eat or drink pat midnight. Okay to have water and/or black coffee only  Hepatic (Liver) Function Test   If you have labs (blood work) drawn today and your tests are completely normal, you will receive your results only by: MyChart Message (if you have MyChart) OR A paper copy in the mail If you have any lab test that is abnormal or we need to change your treatment, we will call you to review the results.  Testing/Procedures: NONE ordered at this time of appointment   Follow-Up: At [facility], you and your health needs are our priority.  As part of our continuing mission to provide you with exceptional heart care, we have created designated Provider Care Teams.  These Care Teams include your primary Cardiologist (physician) and Advanced Practice Providers (APPs -  Physician Assistants and Nurse Practitioners) who all work together to provide you with the care you need, when  you need it.    Your next appointment:   3 month(s)  Provider:   Sande Rives, PA-C        Other Instructions     Signed, Darreld Mclean, PA-C  07/27/2022 5:55 PM    [place]

## 2022-07-18 ENCOUNTER — Other Ambulatory Visit (HOSPITAL_COMMUNITY): Payer: Self-pay

## 2022-07-26 ENCOUNTER — Encounter: Payer: Self-pay | Admitting: Student

## 2022-07-27 ENCOUNTER — Ambulatory Visit: Payer: 59 | Attending: Student | Admitting: Student

## 2022-07-27 ENCOUNTER — Other Ambulatory Visit: Payer: Self-pay

## 2022-07-27 ENCOUNTER — Encounter: Payer: Self-pay | Admitting: Student

## 2022-07-27 ENCOUNTER — Other Ambulatory Visit (HOSPITAL_COMMUNITY): Payer: Self-pay

## 2022-07-27 VITALS — BP 102/68 | HR 75 | Ht 67.0 in | Wt 196.6 lb

## 2022-07-27 DIAGNOSIS — I252 Old myocardial infarction: Secondary | ICD-10-CM

## 2022-07-27 DIAGNOSIS — Z720 Tobacco use: Secondary | ICD-10-CM | POA: Diagnosis not present

## 2022-07-27 DIAGNOSIS — D751 Secondary polycythemia: Secondary | ICD-10-CM | POA: Diagnosis not present

## 2022-07-27 DIAGNOSIS — I2510 Atherosclerotic heart disease of native coronary artery without angina pectoris: Secondary | ICD-10-CM | POA: Diagnosis not present

## 2022-07-27 DIAGNOSIS — E785 Hyperlipidemia, unspecified: Secondary | ICD-10-CM | POA: Diagnosis not present

## 2022-07-27 MED ORDER — NICOTINE 21 MG/24HR TD PT24
21.0000 mg | MEDICATED_PATCH | Freq: Every day | TRANSDERMAL | 0 refills | Status: AC
  Filled 2022-07-27 – 2022-08-08 (×3): qty 28, 28d supply, fill #0

## 2022-07-27 NOTE — Patient Instructions (Signed)
Medication Instructions:   STOP Metoprolol Tartrate   *If you need a refill on your cardiac medications before your next appointment, please call your pharmacy*  Lab Work: Your physician recommends that you return for lab work in 2 months:  Fasting Lipid Panel-DO NOT eat or drink pat midnight. Okay to have water and/or black coffee only  Hepatic (Liver) Function Test   If you have labs (blood work) drawn today and your tests are completely normal, you will receive your results only by: MyChart Message (if you have MyChart) OR A paper copy in the mail If you have any lab test that is abnormal or we need to change your treatment, we will call you to review the results.  Testing/Procedures: NONE ordered at this time of appointment   Follow-Up: At [facility], you and your health needs are our priority.  As part of our continuing mission to provide you with exceptional heart care, we have created designated Provider Care Teams.  These Care Teams include your primary Cardiologist (physician) and Advanced Practice Providers (APPs -  Physician Assistants and Nurse Practitioners) who all work together to provide you with the care you need, when you need it.    Your next appointment:   3 month(s)  Provider:   Sande Rives, PA-C        Other Instructions

## 2022-07-28 ENCOUNTER — Other Ambulatory Visit (HOSPITAL_COMMUNITY): Payer: Self-pay

## 2022-07-29 ENCOUNTER — Other Ambulatory Visit (HOSPITAL_COMMUNITY): Payer: Self-pay

## 2022-08-08 ENCOUNTER — Encounter: Payer: Self-pay | Admitting: Hematology and Oncology

## 2022-08-08 ENCOUNTER — Telehealth (HOSPITAL_COMMUNITY): Payer: Self-pay

## 2022-08-08 ENCOUNTER — Other Ambulatory Visit (HOSPITAL_COMMUNITY): Payer: Self-pay

## 2022-08-08 ENCOUNTER — Other Ambulatory Visit: Payer: Self-pay

## 2022-08-08 ENCOUNTER — Encounter (HOSPITAL_COMMUNITY): Payer: Self-pay

## 2022-08-08 NOTE — Telephone Encounter (Signed)
Attempted to call patient in regards to Cardiac Rehab - LM on VM Mailed letter 

## 2022-08-08 NOTE — Telephone Encounter (Signed)
Pt insurance is active and benefits verified through [employer]. Co-pay $0.00, DED $100.00/$100.00 met, out of pocket $2,500.00/$2,500.00 met, co-insurance 20%. No pre-authorization required. Ces/[employer], 08/08/22 @ 3:20PM, [id number]   How many CR sessions are covered? (36 sessions for TCR, 72 sessions for ICR)72 Is this a lifetime maximum or an annual maximum? Annual Has the member used any of these services to date? No Is there a time limit (weeks/months) on start of program and/or program completion? No     Will contact patient to see if she is interested in the Cardiac Rehab Program.

## 2022-08-09 ENCOUNTER — Other Ambulatory Visit (HOSPITAL_COMMUNITY): Payer: Self-pay

## 2022-08-09 ENCOUNTER — Other Ambulatory Visit: Payer: Self-pay | Admitting: Family Medicine

## 2022-08-09 ENCOUNTER — Other Ambulatory Visit: Payer: Self-pay

## 2022-08-09 DIAGNOSIS — E781 Pure hyperglyceridemia: Secondary | ICD-10-CM | POA: Diagnosis not present

## 2022-08-09 DIAGNOSIS — R69 Illness, unspecified: Secondary | ICD-10-CM

## 2022-08-09 DIAGNOSIS — E7801 Familial hypercholesterolemia: Secondary | ICD-10-CM

## 2022-08-10 ENCOUNTER — Telehealth: Payer: Self-pay | Admitting: Family Medicine

## 2022-08-10 LAB — LIPID PANEL WITH LDL/HDL RATIO
Cholesterol, Total: 147 mg/dL (ref 100–199)
HDL: 45 mg/dL (ref >39)
LDL Chol Calc (NIH): 74 mg/dL (ref 0–99)
LDL/HDL Ratio: 1.6 ratio (ref 0.0–3.2)
Triglycerides: 164 mg/dL — ABNORMAL HIGH (ref 0–149)
VLDL Cholesterol Cal: 28 mg/dL (ref 5–40)

## 2022-08-10 LAB — HEPATIC FUNCTION PANEL
ALT: 41 IU/L — ABNORMAL HIGH (ref 0–32)
AST: 34 IU/L (ref 0–40)
Albumin: 5 g/dL — ABNORMAL HIGH (ref 3.9–4.9)
Alkaline Phosphatase: 136 IU/L — ABNORMAL HIGH (ref 44–121)
Bilirubin Total: <0.2 mg/dL (ref 0.0–1.2)
Bilirubin, Direct: <0.1 mg/dL (ref 0.00–0.40)
Total Protein: 7.2 g/dL (ref 6.0–8.5)

## 2022-08-10 NOTE — Telephone Encounter (Signed)
Copied from [place] [id number]. Topic: General - Other >> Aug 10, 2022 12:56 PM Chapman Fitch wrote: Reason for CRM: Pt would like a call from Baxter Flattery to go over her lab results/ please advise

## 2022-08-11 NOTE — Telephone Encounter (Signed)
Will call pt with results.  KP

## 2022-08-22 ENCOUNTER — Telehealth: Payer: Self-pay | Admitting: Family Medicine

## 2022-08-22 NOTE — Telephone Encounter (Signed)
Left voice mail to reschedule April 4th appt.

## 2022-08-25 ENCOUNTER — Ambulatory Visit: Payer: 59 | Admitting: Family Medicine

## 2022-08-26 ENCOUNTER — Encounter: Payer: Self-pay | Admitting: Hematology and Oncology

## 2022-08-29 ENCOUNTER — Encounter: Payer: Self-pay | Admitting: Family Medicine

## 2022-08-29 ENCOUNTER — Ambulatory Visit (INDEPENDENT_AMBULATORY_CARE_PROVIDER_SITE_OTHER): Payer: 59 | Admitting: Family Medicine

## 2022-08-29 ENCOUNTER — Encounter: Payer: Self-pay | Admitting: Hematology and Oncology

## 2022-08-29 VITALS — BP 120/70 | HR 86 | Ht 67.0 in | Wt 196.0 lb

## 2022-08-29 DIAGNOSIS — F17210 Nicotine dependence, cigarettes, uncomplicated: Secondary | ICD-10-CM

## 2022-08-29 DIAGNOSIS — D751 Secondary polycythemia: Secondary | ICD-10-CM

## 2022-08-29 NOTE — Progress Notes (Signed)
Date:  08/29/2022   Name:  Anna Cameron   DOB:  03-02-1976   MRN:  [id number]   Chief Complaint: elevated hemoglobin  HPI  Lab Results  Component Value Date   NA 141 07/13/2022   K 4.6 07/13/2022   CO2 23 07/13/2022   GLUCOSE 98 07/13/2022   BUN 18 07/13/2022   CREATININE 0.84 07/13/2022   CALCIUM 9.9 07/13/2022   EGFR 87 07/13/2022   GFRNONAA >60 07/08/2022   Lab Results  Component Value Date   CHOL 147 08/09/2022   HDL 45 08/09/2022   LDLCALC 74 08/09/2022   TRIG 164 (H) 08/09/2022   CHOLHDL 6.3 07/06/2022   Lab Results  Component Value Date   TSH 2.210 01/24/2020   Lab Results  Component Value Date   HGBA1C 5.6 07/06/2022   Lab Results  Component Value Date   WBC 7.5 07/13/2022   HGB 17.0 (H) 07/13/2022   HCT 50.4 (H) 07/13/2022   MCV 94 07/13/2022   PLT 337 07/13/2022   Lab Results  Component Value Date   ALT 41 (H) 08/09/2022   AST 34 08/09/2022   ALKPHOS 136 (H) 08/09/2022   BILITOT <0.2 08/09/2022   No results found for: "25OHVITD2", "25OHVITD3", "VD25OH"   Review of Systems  Constitutional: Negative.  Negative for chills, fatigue, fever and unexpected weight change.  HENT:  Negative for congestion, ear discharge, ear pain, rhinorrhea, sinus pressure, sneezing and sore throat.   Respiratory:  Negative for cough, shortness of breath, wheezing and stridor.   Gastrointestinal:  Negative for abdominal pain, blood in stool, constipation, diarrhea and nausea.  Genitourinary:  Negative for dysuria, flank pain, frequency, hematuria, urgency and vaginal discharge.  Musculoskeletal:  Negative for arthralgias, back pain and myalgias.  Skin:  Negative for rash.  Neurological:  Negative for dizziness, weakness and headaches.  Hematological:  Negative for adenopathy. Does not bruise/bleed easily.  Psychiatric/Behavioral:  Negative for dysphoric mood. The patient is not nervous/anxious.     Patient Active Problem List   Diagnosis Date Noted   CAD  (coronary artery disease) 07/08/2022   Elevated BP without diagnosis of hypertension 07/08/2022   Hyperlipidemia 07/08/2022   NSTEMI (non-ST elevated myocardial infarction) 07/06/2022   MDD (major depressive disorder), recurrent episode, mild 09/14/2021   Insomnia 09/14/2021   Tobacco abuse 09/14/2021   Alcohol use disorder, moderate, in sustained remission 09/14/2021   H/O total hysterectomy 07/14/2020   [name] 02/19/2020   Weight loss, unintentional 09/22/2016    No Known Allergies  Past Surgical History:  Procedure Laterality Date   ABDOMINAL HYSTERECTOMY     bladder tact     BOTOX INJECTION     CESAREAN SECTION     x 3   CORONARY STENT INTERVENTION N/A 07/07/2022   Procedure: CORONARY STENT INTERVENTION;  Surgeon: Orbie Pyo, MD;  Location: [facility];  Service: Cardiovascular;  Laterality: N/A;   CYSTOSCOPY WITH INJECTION N/A 09/05/2019   Procedure: CYSTOSCOPY WITH INJECTION BLADDER BOTOX;  Surgeon: Orson Ape, MD;  Location: [facility];  Service: Urology;  Laterality: N/A;   LEFT HEART CATH AND CORONARY ANGIOGRAPHY N/A 07/07/2022   Procedure: LEFT HEART CATH AND CORONARY ANGIOGRAPHY;  Surgeon: Orbie Pyo, MD;  Location: [facility];  Service: Cardiovascular;  Laterality: N/A;    Social History   Tobacco Use   Smoking status: Every Day    Packs/day: 1.50    Years: 29.00    Additional pack years: 0.00  Total pack years: 43.50    Types: Cigarettes   Smokeless tobacco: Never  Vaping Use   Vaping Use: Never used  Substance Use Topics   Alcohol use: Not Currently   Drug use: Never     Medication list has been reviewed and updated.  Current Meds  Medication Sig   aspirin 81 MG chewable tablet Chew 1 tablet (81 mg total) by mouth daily.   atorvastatin (LIPITOR) 80 MG tablet Take 1 tablet (80 mg total) by mouth at bedtime.   fexofenadine (ALLEGRA) 180 MG tablet Take 180 mg by mouth in the morning.   Multiple Vitamin  (MULTIVITAMIN WITH MINERALS) TABS tablet Take 1 tablet by mouth daily with breakfast.   nicotine (NICODERM CQ - DOSED IN MG/24 HOURS) 21 mg/24hr patch Place 1 patch (21 mg total) onto the skin daily.   nitroGLYCERIN (NITROSTAT) 0.4 MG SL tablet Place 1 tablet (0.4 mg total) under the tongue every 5 (five) minutes as needed for chest pain.   ticagrelor (BRILINTA) 90 MG TABS tablet Take 1 tablet (90 mg total) by mouth 2 (two) times daily.   Vibegron (GEMTESA) 75 MG TABS Take 1 tablet (75 mg total) by mouth daily. (Patient taking differently: Take 75 mg by mouth every evening.)   vitamin B-12 (CYANOCOBALAMIN) 1000 MCG tablet Take 1,000 mcg by mouth in the morning.       08/29/2022    3:55 PM 07/11/2022    4:33 PM 09/14/2021   11:25 AM 05/18/2021    3:00 PM  GAD 7 : Generalized Anxiety Score  Nervous, Anxious, on Edge 0 0  0  Control/stop worrying 0 0  0  Worry too much - different things 0 0  0  Trouble relaxing 0 0  1  Restless 0 0  1  Easily annoyed or irritable 0 0  3  Afraid - awful might happen 0 0  0  Total GAD 7 Score 0 0  5  Anxiety Difficulty Not difficult at all Not difficult at all  Not difficult at all     Information is confidential and restricted. Go to Review Flowsheets to unlock data.       08/29/2022    3:55 PM 07/11/2022    4:33 PM 09/14/2021   11:01 AM  Depression screen PHQ 2/9  Decreased Interest 0 0   Down, Depressed, Hopeless 0 0   PHQ - 2 Score 0 0   Altered sleeping 0 0   Tired, decreased energy 0 0   Change in appetite 0 0   Feeling bad or failure about yourself  0 0   Trouble concentrating 0 0   Moving slowly or fidgety/restless 0 0   Suicidal thoughts 0 0   PHQ-9 Score 0 0   Difficult doing work/chores Not difficult at all Not difficult at all      Information is confidential and restricted. Go to Review Flowsheets to unlock data.    BP Readings from Last 3 Encounters:  08/29/22 120/70  07/27/22 102/68  07/11/22 120/62    Physical Exam HENT:      Right Ear: Tympanic membrane and ear canal normal.     Left Ear: Tympanic membrane and ear canal normal.     Nose: Nose normal.     Mouth/Throat:     Mouth: Mucous membranes are moist.  Eyes:     Conjunctiva/sclera: Conjunctivae normal.  Cardiovascular:     Heart sounds: No murmur heard.    No friction rub. No  gallop.  Pulmonary:     Breath sounds: No wheezing, rhonchi or rales.  Abdominal:     Tenderness: There is no abdominal tenderness. There is no guarding.  Musculoskeletal:     Cervical back: Normal range of motion.     Wt Readings from Last 3 Encounters:  08/29/22 196 lb (88.9 kg)  07/27/22 196 lb 9.6 oz (89.2 kg)  07/11/22 199 lb (90.3 kg)    BP 120/70   Pulse 86   Ht 5\' 7"  (1.702 m)   Wt 196 lb (88.9 kg)   SpO2 98%   BMI 30.70 kg/m   Assessment and Plan:  1. Polycythemia Patient with history of polycythemia which goes back to 2021 when she saw Dr. Merlene Pulling in hematology at which time it was suggested that she have phlebotomy and frequent checks, which she did not follow-up.  Patient has not donated plasma since February and we will repeat CBC to see if hemoglobin is still elevated and pending results we may refer to hematology. - CBC w/Diff/Platelet  2. Cigarette nicotine dependence without complication Unfortunately patient has also resumed smoking but it only just over half a pack a day.  We rediscussed the risk of this and patient is aware.Patient has been advised of the health risks of smoking and counseled concerning cessation of tobacco products.    Elizabeth Sauer, MD

## 2022-08-30 LAB — CBC WITH DIFFERENTIAL/PLATELET
Basophils Absolute: 0.1 10*3/uL (ref 0.0–0.2)
Basos: 1 %
EOS (ABSOLUTE): 0.3 10*3/uL (ref 0.0–0.4)
Eos: 3 %
Hematocrit: 44.1 % (ref 34.0–46.6)
Hemoglobin: 14.8 g/dL (ref 11.1–15.9)
Immature Grans (Abs): 0 10*3/uL (ref 0.0–0.1)
Immature Granulocytes: 0 %
Lymphocytes Absolute: 2.4 10*3/uL (ref 0.7–3.1)
Lymphs: 28 %
MCH: 31.1 pg (ref 26.6–33.0)
MCHC: 33.6 g/dL (ref 31.5–35.7)
MCV: 93 fL (ref 79–97)
Monocytes Absolute: 0.8 10*3/uL (ref 0.1–0.9)
Monocytes: 9 %
Neutrophils Absolute: 5 10*3/uL (ref 1.4–7.0)
Neutrophils: 59 %
Platelets: 329 10*3/uL (ref 150–450)
RBC: 4.76 x10E6/uL (ref 3.77–5.28)
RDW: 12.2 % (ref 11.7–15.4)
WBC: 8.5 10*3/uL (ref 3.4–10.8)

## 2022-09-02 ENCOUNTER — Other Ambulatory Visit (HOSPITAL_COMMUNITY): Payer: Self-pay

## 2022-10-04 ENCOUNTER — Other Ambulatory Visit: Payer: Self-pay | Admitting: Student

## 2022-10-04 ENCOUNTER — Other Ambulatory Visit (HOSPITAL_COMMUNITY): Payer: Self-pay

## 2022-10-04 ENCOUNTER — Other Ambulatory Visit: Payer: Self-pay | Admitting: Urology

## 2022-10-04 DIAGNOSIS — N3941 Urge incontinence: Secondary | ICD-10-CM

## 2022-10-04 MED ORDER — ATORVASTATIN CALCIUM 80 MG PO TABS
80.0000 mg | ORAL_TABLET | Freq: Every day | ORAL | 11 refills | Status: DC
  Filled 2022-10-04: qty 30, 30d supply, fill #0

## 2022-10-12 ENCOUNTER — Other Ambulatory Visit (HOSPITAL_COMMUNITY): Payer: Self-pay

## 2022-10-14 ENCOUNTER — Encounter: Payer: Self-pay | Admitting: Urology

## 2022-10-14 ENCOUNTER — Other Ambulatory Visit: Payer: Self-pay

## 2022-10-14 ENCOUNTER — Encounter: Payer: Self-pay | Admitting: Hematology and Oncology

## 2022-10-14 ENCOUNTER — Other Ambulatory Visit (HOSPITAL_COMMUNITY): Payer: Self-pay

## 2022-10-14 DIAGNOSIS — N3941 Urge incontinence: Secondary | ICD-10-CM

## 2022-10-14 MED ORDER — GEMTESA 75 MG PO TABS
75.0000 mg | ORAL_TABLET | Freq: Every day | ORAL | 11 refills | Status: AC
  Filled 2022-10-14: qty 30, 30d supply, fill #0
  Filled 2022-11-23: qty 30, 30d supply, fill #1
  Filled 2023-01-11 – 2023-01-30 (×2): qty 30, 30d supply, fill #2

## 2022-10-18 ENCOUNTER — Other Ambulatory Visit (HOSPITAL_COMMUNITY): Payer: Self-pay

## 2022-10-18 ENCOUNTER — Other Ambulatory Visit: Payer: Self-pay

## 2022-10-24 NOTE — Progress Notes (Unsigned)
Cardiology Office Note:    Date:  10/24/2022   ID:  Anna Cameron, DOB Feb 11, 1976, MRN [id number]  PCP:  Duanne Limerick, MD  Cardiologist:  Olga Millers, MD  Electrophysiologist:  None   Referring MD: Duanne Limerick, MD   Chief Complaint: follow-up of CAD  History of Present Illness:    Anna Cameron is a 47 y.o. female with a history of CAD with NSTEMI in 06/2022 s/p DES to 1st Diag, hyperlipidemia, erythrocytosis, anxiety, prior ETOH abuse, and tobacco abuse who is followed by Dr. Jens Som and presents today for routine follow-up.   Patient was first seen by Cardiology during admission for NSTEMI in 06/2022. Marland Kitchen  LHC showed 95% stenosis of the 1st Diag and otherwise normal coronaries. She underwent successful PCI with DES to the 1st Diag. Echo showed LVEF of 60-65%, no regional wall motion abnormalities, grade 1 diastolic dysfunction, normal RV, and mild to moderate TR. She was started on DAPT with Aspirin and Brilinta as well as a beta-blocker and high-intensity statin.   She was last seen by me in 07/2022 at which time she was doing well with no recurrent chest pain. Her only complaint at that time was fatigue. She stated this was not entirely new but felt like it was worse after new medications. It was felt that this was likely due to the beta-blocker so this was stopped.   Patient presents today for follow-up. ***  CAD  Admitted in 06/2022 with NSTEMI and underwent DES to 1st Diag.  - No recurrent chest pain.  - Previously on Metoprolol but this was stopped due to fatigue.  - Continue DAPT with Aspirin 81mg  daily and Brilinta 90mg  twice daily. - Continue Lipitor 80mg  daily.   Hyperlipidemia Lipid panel in 07/2022: Total Cholesterol 147, Triglycerides  164, HDL 45 LDL 74. LDL goal <70 given CAD. - Continue Lipitor 80mg  daily.  - Will start Zetia 10mg  daily. Will then need repeat lipid panel and LFTs in about 3 months. ***   [name]  Patient has a history of  erythrocytosis that was previously treated with phlebotomy. Most recent hemoglobin 14.8 at PCP's office in 08/2022.  - Followed by PCP.     Tobacco Abuse She continues to smoke but has cut down from 1.5 packs per day to 1/2 pack per day. *** - Congratulated patient on progress made so far and encouraged her to continue to work towards complete cessation. ***  Past Medical History:  Diagnosis Date   Allergy    Anxiety    CAD (coronary artery disease)    Depression    [name]    Hyperlipidemia    NSTEMI (non-ST elevated myocardial infarction) (HCC)    s/p DES to 1st Diag in 06/2022   Tobacco abuse     Past Surgical History:  Procedure Laterality Date   ABDOMINAL HYSTERECTOMY     bladder tact     BOTOX INJECTION     CESAREAN SECTION     x 3   CORONARY STENT INTERVENTION N/A 07/07/2022   Procedure: CORONARY STENT INTERVENTION;  Surgeon: Orbie Pyo, MD;  [facility]: [facility];  Service: Cardiovascular;  Laterality: N/A;   CYSTOSCOPY WITH INJECTION N/A 09/05/2019   Procedure: CYSTOSCOPY WITH INJECTION BLADDER BOTOX;  Surgeon: Orson Ape, MD;  Location: [facility];  Service: Urology;  Laterality: N/A;   LEFT HEART CATH AND CORONARY ANGIOGRAPHY N/A 07/07/2022   Procedure: LEFT HEART CATH AND CORONARY ANGIOGRAPHY;  Surgeon: Lynnette Caffey,  Charlies Constable, MD;  Location: [facility];  Service: Cardiovascular;  Laterality: N/A;    Current Medications: No outpatient medications have been marked as taking for the 11/01/22 encounter (Appointment) with Corrin Parker, PA-C.     Allergies:   Patient has no known allergies.   Social History   Socioeconomic History   Marital status: Married    Spouse name: Not on file   Number of children: 5   Years of education: ged   Highest education level: Not on file  Occupational History   Not on file  Tobacco Use   Smoking status: Every Day    Packs/day: 1.50    Years: 29.00    Additional pack years: 0.00    Total pack  years: 43.50    Types: Cigarettes   Smokeless tobacco: Never  Vaping Use   Vaping Use: Never used  Substance and Sexual Activity   Alcohol use: Not Currently   Drug use: Never   Sexual activity: Not Currently  Other Topics Concern   Not on file  Social History Narrative   Not on file   Social Determinants of Health   Financial Resource Strain: Not on file  Food Insecurity: No Food Insecurity (07/07/2022)   Hunger Vital Sign    Worried About Running Out of Food in the Last Year: Never true    Ran Out of Food in the Last Year: Never true  Transportation Needs: No Transportation Needs (07/07/2022)   PRAPARE - Administrator, Civil Service (Medical): No    Lack of Transportation (Non-Medical): No  Physical Activity: Not on file  Stress: Not on file  Social Connections: Not on file     Family History: The patient's family history includes Cancer in her father and maternal grandmother; Schizophrenia in her brother.  ROS:   Please see the history of present illness.     EKGs/Labs/Other Studies Reviewed:    The following studies were reviewed:  Left Cardiac Catheterization 07/07/2022:   1st Diag lesion is 95% stenosed.   A stent was successfully placed.   Post intervention, there is a 0% residual stenosis.   1.  High-grade first diagonal lesion treated with 1 drug-eluting stent. 2.  LVEDP of 14 mmHg.   Recommendation: Dual antiplatelet therapy for 1 year and optimal medical therapy for cardiovascular risk factors.   Diagnostic Dominance: Right  Intervention        _____________   Echocardiogram 07/08/2022: Impressions: 1. Left ventricular ejection fraction, by estimation, is 60 to 65%. The  left ventricle has normal function. The left ventricle has no regional  wall motion abnormalities. Left ventricular diastolic parameters are  consistent with Grade I diastolic  dysfunction (impaired relaxation).   2. Right ventricular systolic function is normal.  The right ventricular  size is normal. There is normal pulmonary artery systolic pressure. The  estimated right ventricular systolic pressure is 24.9 mmHg.   3. Left atrial size was mildly dilated.   4. The mitral valve is normal in structure. No evidence of mitral valve  regurgitation. No evidence of mitral stenosis.   5. Tricuspid valve regurgitation is mild to moderate.   6. The aortic valve is normal in structure. Aortic valve regurgitation is  not visualized. No aortic stenosis is present.   7. The inferior vena cava is normal in size with greater than 50%  respiratory variability, suggesting right atrial pressure of 3 mmHg.   EKG:  EKG not ordered today.  Recent Labs: 07/13/2022: BUN 18; Creatinine, Ser 0.84; Potassium 4.6; Sodium 141 08/09/2022: ALT 41 08/29/2022: Hemoglobin 14.8; Platelets 329  Recent Lipid Panel    Component Value Date/Time   CHOL 147 08/09/2022 1655   TRIG 164 (H) 08/09/2022 1655   HDL 45 08/09/2022 1655   CHOLHDL 6.3 07/06/2022 1355   VLDL 37 07/06/2022 1355   LDLCALC 74 08/09/2022 1655    Physical Exam:    Vital Signs: There were no vitals taken for this visit.    Wt Readings from Last 3 Encounters:  08/29/22 196 lb (88.9 kg)  07/27/22 196 lb 9.6 oz (89.2 kg)  07/11/22 199 lb (90.3 kg)     General: 47 y.o. female in no acute distress. HEENT: Normocephalic and atraumatic. Sclera clear. EOMs intact. Neck: Supple. No carotid bruits. No JVD. Heart: *** RRR. Distinct S1 and S2. No murmurs, gallops, or rubs. Radial and distal pedal pulses 2+ and equal bilaterally. Lungs: No increased work of breathing. Clear to ausculation bilaterally. No wheezes, rhonchi, or rales.  Abdomen: Soft, non-distended, and non-tender to palpation. Bowel sounds present in all 4 quadrants.  MSK: Normal strength and tone for age. *** Extremities: No lower extremity edema.    Skin: Warm and dry. Neuro: Alert and oriented x3. No focal deficits. Psych: Normal affect. Responds  appropriately.   Assessment:    No diagnosis found.  Plan:     Disposition: Follow up in ***   Medication Adjustments/Labs and Tests Ordered: Current medicines are reviewed at length with the patient today.  Concerns regarding medicines are outlined above.  No orders of the defined types were placed in this encounter.  No orders of the defined types were placed in this encounter.   There are no Patient Instructions on file for this visit.   Signed, Corrin Parker, PA-C  10/24/2022 3:21 PM    [facility]

## 2022-11-01 ENCOUNTER — Ambulatory Visit: Payer: 59 | Attending: Student | Admitting: Student

## 2022-11-01 ENCOUNTER — Other Ambulatory Visit (HOSPITAL_COMMUNITY): Payer: Self-pay

## 2022-11-01 ENCOUNTER — Encounter: Payer: Self-pay | Admitting: Student

## 2022-11-01 VITALS — BP 130/60 | HR 77 | Ht 67.0 in | Wt 202.0 lb

## 2022-11-01 DIAGNOSIS — D751 Secondary polycythemia: Secondary | ICD-10-CM | POA: Diagnosis not present

## 2022-11-01 DIAGNOSIS — I2510 Atherosclerotic heart disease of native coronary artery without angina pectoris: Secondary | ICD-10-CM

## 2022-11-01 DIAGNOSIS — E785 Hyperlipidemia, unspecified: Secondary | ICD-10-CM

## 2022-11-01 DIAGNOSIS — Z720 Tobacco use: Secondary | ICD-10-CM

## 2022-11-01 MED ORDER — ROSUVASTATIN CALCIUM 40 MG PO TABS
40.0000 mg | ORAL_TABLET | Freq: Every day | ORAL | 3 refills | Status: DC
  Filled 2022-11-01 – 2022-11-14 (×2): qty 90, 90d supply, fill #0

## 2022-11-01 NOTE — Patient Instructions (Signed)
Medication Instructions:  Stop Lipitor. Start Crestor 40 mg ( Take 1 Tablet Daily). *If you need a refill on your cardiac medications before your next appointment, please call your pharmacy*   Lab Work: Lipid Panel , Hepatic Panel : To be Done 2-3 Months If you have labs (blood work) drawn today and your tests are completely normal, you will receive your results only by: MyChart Message (if you have MyChart) OR A paper copy in the mail If you have any lab test that is abnormal or we need to change your treatment, we will call you to review the results.   Testing/Procedures: No Testing   Follow-Up: At [facility], you and your health needs are our priority.  As part of our continuing mission to provide you with exceptional heart care, we have created designated Provider Care Teams.  These Care Teams include your primary Cardiologist (physician) and Advanced Practice Providers (APPs -  Physician Assistants and Nurse Practitioners) who all work together to provide you with the care you need, when you need it.  We recommend signing up for the patient portal called "MyChart".  Sign up information is provided on this After Visit Summary.  MyChart is used to connect with patients for Virtual Visits (Telemedicine).  Patients are able to view lab/test results, encounter notes, upcoming appointments, etc.  Non-urgent messages can be sent to your provider as well.   To learn more about what you can do with MyChart, go to [email or web address].    Your next appointment:   6 month(s)  Provider:   Marjie Skiff, PA-C    or , Olga Millers, MD

## 2022-11-09 ENCOUNTER — Other Ambulatory Visit (HOSPITAL_COMMUNITY): Payer: Self-pay

## 2022-11-14 ENCOUNTER — Other Ambulatory Visit (HOSPITAL_COMMUNITY): Payer: Self-pay

## 2022-11-23 ENCOUNTER — Other Ambulatory Visit (HOSPITAL_COMMUNITY): Payer: Self-pay

## 2022-11-23 ENCOUNTER — Other Ambulatory Visit: Payer: Self-pay

## 2022-12-29 ENCOUNTER — Other Ambulatory Visit: Payer: Self-pay | Admitting: Oncology

## 2022-12-29 DIAGNOSIS — Z006 Encounter for examination for normal comparison and control in clinical research program: Secondary | ICD-10-CM

## 2023-01-24 ENCOUNTER — Other Ambulatory Visit (HOSPITAL_COMMUNITY): Payer: Self-pay

## 2023-01-26 ENCOUNTER — Other Ambulatory Visit (HOSPITAL_COMMUNITY): Payer: Self-pay

## 2023-01-30 ENCOUNTER — Encounter: Payer: Self-pay | Admitting: Hematology and Oncology

## 2023-01-30 ENCOUNTER — Other Ambulatory Visit (HOSPITAL_COMMUNITY): Payer: Self-pay

## 2023-03-09 ENCOUNTER — Encounter: Payer: Self-pay | Admitting: Hematology and Oncology

## 2023-03-30 ENCOUNTER — Other Ambulatory Visit: Payer: Self-pay

## 2023-04-11 ENCOUNTER — Other Ambulatory Visit: Payer: Self-pay

## 2023-04-27 ENCOUNTER — Other Ambulatory Visit: Payer: Self-pay

## 2023-06-05 ENCOUNTER — Other Ambulatory Visit (HOSPITAL_COMMUNITY): Payer: Self-pay

## 2023-11-27 ENCOUNTER — Ambulatory Visit
Admission: EM | Admit: 2023-11-27 | Discharge: 2023-11-27 | Disposition: A | Discharge status: Home / Self Care | Attending: Family Medicine | Admitting: Family Medicine

## 2023-11-27 ENCOUNTER — Encounter: Payer: Self-pay | Admitting: Hematology and Oncology

## 2023-11-27 ENCOUNTER — Ambulatory Visit

## 2023-11-27 ENCOUNTER — Ambulatory Visit: Payer: Self-pay | Admitting: Family Medicine

## 2023-11-27 DIAGNOSIS — M25562 Pain in left knee: Secondary | ICD-10-CM

## 2023-11-27 DIAGNOSIS — W19XXXA Unspecified fall, initial encounter: Secondary | ICD-10-CM

## 2023-11-27 MED ORDER — NAPROXEN 500 MG PO TABS: 500.0000 mg | ORAL_TABLET | Freq: Two times a day (BID) | ORAL | 0 refills | Status: DC

## 2023-11-27 MED ORDER — CYCLOBENZAPRINE HCL 5 MG PO TABS: 5.0000 mg | ORAL_TABLET | Freq: Every evening | ORAL | 0 refills | Status: DC | PRN

## 2023-11-27 NOTE — Discharge Instructions (Addendum)
 If medication was prescribed, stop by the pharmacy to pick up your prescriptions.  For your  pain, Take Naprosyn  twice a day,  as needed for pain. Tylenol  can provide additional pain relief. I also prescribed a muscle relaxer/Flexeril /cyclobenzaprine .  Wear the knee brace provided.  Rest and elevate the affected painful area.  Apply warm compresses intermittently, as needed.  As pain recedes, begin normal activities slowly as tolerated.  Follow up with primary care provider or an orthopedic provider, if symptoms persist.

## 2023-11-27 NOTE — ED Triage Notes (Signed)
 Pt c/o L knee pain that started 1 hr ago. States was coming down some steps at work & knee gave out.

## 2023-11-27 NOTE — ED Provider Notes (Signed)
 [facility]    CSN: [id number] Arrival date & time: 11/27/23  1751      History   Chief Complaint Chief Complaint  Patient presents with   Knee Pain    HPI  HPI Anna Cameron is a 48 y.o. female.   Karri presents for left knee pain that started after stepping over a coveryour belt at work. She was wheeled to the front door. Her husband helped her get into the building.  Says her right knee gave out  when she was trying to step over the belt. Whenever she puts pressure on it to walk, she feels a pop and it feels unstable.  No previous knee injury. She iced it at work and took ibuprofen 30 minutes ago.  Has small ache in her knee.   out when every she steps to put press on it.     Past Medical History:  Diagnosis Date   Allergy    Anxiety    CAD (coronary artery disease)    Depression    [name]    Hyperlipidemia    NSTEMI (non-ST elevated myocardial infarction) (HCC)    s/p DES to 1st Diag in 06/2022   Tobacco abuse     Patient Active Problem List   Diagnosis Date Noted   CAD (coronary artery disease) 07/08/2022   Elevated BP without diagnosis of hypertension 07/08/2022   Hyperlipidemia 07/08/2022   NSTEMI (non-ST elevated myocardial infarction) (HCC) 07/06/2022   MDD (major depressive disorder), recurrent episode, mild (HCC) 09/14/2021   Insomnia 09/14/2021   Tobacco abuse 09/14/2021   Alcohol use disorder, moderate, in sustained remission (HCC) 09/14/2021   H/O total hysterectomy 07/14/2020   [name] 02/19/2020   Weight loss, unintentional 09/22/2016    Past Surgical History:  Procedure Laterality Date   ABDOMINAL HYSTERECTOMY     bladder tact     BOTOX  INJECTION     CESAREAN SECTION     x 3   CORONARY STENT INTERVENTION N/A 07/07/2022   Procedure: CORONARY STENT INTERVENTION;  Surgeon: Thukkani, Arun K, MD;  Location: [facility];  Service: Cardiovascular;  Laterality: N/A;   CYSTOSCOPY WITH INJECTION N/A 09/05/2019    Procedure: CYSTOSCOPY WITH INJECTION BLADDER BOTOX ;  Surgeon: Kassie Ozell SAUNDERS, MD;  Location: [facility];  Service: Urology;  Laterality: N/A;   LEFT HEART CATH AND CORONARY ANGIOGRAPHY N/A 07/07/2022   Procedure: LEFT HEART CATH AND CORONARY ANGIOGRAPHY;  Surgeon: Wendel Lurena POUR, MD;  Location: [facility];  Service: Cardiovascular;  Laterality: N/A;    OB History   No obstetric history on file.      Home Medications    Prior to Admission medications   Medication Sig Start Date End Date Taking? Authorizing Provider  aspirin  81 MG chewable tablet Chew 1 tablet (81 mg total) by mouth daily. 07/08/22   Goodrich, Callie E, PA-C  fexofenadine (ALLEGRA) 180 MG tablet Take 180 mg by mouth in the morning.    [provider]  Multiple Vitamin (MULTIVITAMIN WITH MINERALS) TABS tablet Take 1 tablet by mouth daily with breakfast.    [provider]  nicotine  (NICODERM CQ  - DOSED IN MG/24 HOURS) 21 mg/24hr patch Place 1 patch (21 mg total) onto the skin daily. 07/27/22   Goodrich, Callie E, PA-C  nicotine  polacrilex (COMMIT) 4 MG lozenge Use as directed Patient not taking: Reported on 11/01/2022 07/15/22   Donna Burnard HERO, [facility]  nitroGLYCERIN  (NITROSTAT ) 0.4 MG SL tablet Place 1 tablet (0.4  mg total) under the tongue every 5 (five) minutes as needed for chest pain. 07/08/22   Goodrich, Callie E, PA-C  rosuvastatin  (CRESTOR ) 40 MG tablet Take 1 tablet (40 mg total) by mouth daily. 11/01/22 02/12/23  Goodrich, Callie E, PA-C  ticagrelor  (BRILINTA ) 90 MG TABS tablet Take 1 tablet (90 mg total) by mouth 2 (two) times daily. 07/08/22   Goodrich, Callie E, PA-C  Vibegron  (GEMTESA ) 75 MG TABS Take 1 tablet (75 mg total) by mouth daily. 10/14/22   Gaston Hamilton, MD  vitamin B-12 (CYANOCOBALAMIN) 1000 MCG tablet Take 1,000 mcg by mouth in the morning.    [provider]    Family History Family History  Problem Relation Age of Onset   Cancer Father    Schizophrenia Brother     Cancer Maternal Grandmother     Social History Social History   Tobacco Use   Smoking status: Every Day    Current packs/day: 1.50    Average packs/day: 1.5 packs/day for 29.0 years (43.5 ttl pk-yrs)    Types: Cigarettes   Smokeless tobacco: Never  Vaping Use   Vaping status: Never Used  Substance Use Topics   Alcohol use: Not Currently   Drug use: Never     Allergies   Patient has no known allergies.   Review of Systems Review of Systems: :negative unless otherwise stated in HPI.      Physical Exam Triage Vital Signs ED Triage Vitals [11/27/23 1758]  Encounter Vitals Group     BP      Girls Systolic BP Percentile      Girls Diastolic BP Percentile      Boys Systolic BP Percentile      Boys Diastolic BP Percentile      Pulse      Resp 16     Temp      Temp Source Oral     SpO2      Weight      Height      Head Circumference      Peak Flow      Pain Score      Pain Loc      Pain Education      Exclude from Growth Chart    No data found.  Updated Vital Signs Resp 16   Visual Acuity Right Eye Distance:   Left Eye Distance:   Bilateral Distance:    Right Eye Near:   Left Eye Near:    Bilateral Near:     Physical Exam GEN: well appearing female in no acute distress  CVS: well perfused  RESP: speaking in full sentences without pause, no respiratory distress  MSK:   *** shoulder:  No evidence of bony deformity, asymmetry, or muscle atrophy. No tenderness over long head of biceps (bicipital groove).  No TTP at [facility] joint.  Full active and passive (ABD, ADD, Flexion, extension, IR, ER). Limited *** 2/2 to pain.  Strength 5/5 grip, elbow and shoulder. No abnormal scapular function observed.  Special Tests: Vonzell: ***; Empty Can: ***, Neer's: Negative; Painful arc: Negative; Anterior Apprehension: Negative Sensation intact. Peripheral pulses intact.   UC Treatments / Results  Labs (all labs ordered are listed, but only abnormal results are  displayed) Labs Reviewed - No data to display  EKG   Radiology No results found.   Procedures Procedures (including critical care time)  Medications Ordered in UC Medications - No data to display  Initial Impression / Assessment and Plan / UC  Course  I have reviewed the triage vital signs and the nursing notes.  Pertinent labs & imaging results that were available during my care of the patient were reviewed by me and considered in my medical decision making (see chart for details).      Pt is a 48 y.o.  female with *** days of *** shoulder pain after ***.   On exam, pt has tenderness at *** concerning for ***.   Obtained *** shoulder plain films.  Personally interpreted by me were ***unremarkable for fracture or dislocation. Radiologist report reviewed and additionally notes *** no soft tissue swelling.  Given ***Toradol IM/sling/brace/crutches  Patient to gradually return to normal activities, as tolerated and continue ordinary activities within the limits permitted by pain. Prescribed Naproxen  sodium *** and muscle relaxer *** for pain relief.  Tylenol  PRN. Advised patient to avoid OTC NSAIDs while taking prescription NSAID. Counseled patient on red flag symptoms and when to seek immediate care.  ***No red flags such as progressive major motor weakness.   Patient to follow up with orthopedic provider, if symptoms do not improve with conservative treatment.  Return and ED precautions given. Understanding voiced. Discussed MDM, treatment plan and plan for follow-up with patient/parent who agrees with plan.   Final Clinical Impressions(s) / UC Diagnoses   Final diagnoses:  None   Discharge Instructions   None    ED Prescriptions   None    PDMP not reviewed this encounter.

## 2024-01-17 ENCOUNTER — Telehealth: Payer: Self-pay

## 2024-01-17 NOTE — Telephone Encounter (Signed)
 Callback team please contact requesting provider to see if Brilinta  is also needed to be held in addition to aspirin ?  Thanks,

## 2024-01-17 NOTE — Telephone Encounter (Signed)
 Spoke to Anna Cameron at [facility] and was made aware the patient took herself off all of her meds and only takes an Aspirin . She has not seen us  since June of last year and the pre-op APPs have been made aware.

## 2024-01-17 NOTE — Telephone Encounter (Signed)
   Pre-operative Risk Assessment    Patient Name: Anna Cameron  DOB: 1975/12/10 MRN: [id number]   Date of last office visit: 11/01/23 [name], PA-C Date of next office visit: NONE   Request for Surgical Clearance    Procedure:  LEFT KNEE ACL/ MENISC  Date of Surgery:  Clearance 01/29/24                                Surgeon:  DR ROCKEY CHARLES Surgeon's Group or Practice Name:  [facility] Phone number:  [phone]   EXT 1827 Fax number:  [phone]   Type of Clearance Requested:   - Medical  - Pharmacy:  Hold Aspirin      Type of Anesthesia:  REGIONAL   Additional requests/questions:    SignedLucie DELENA Ku   01/17/2024, 8:09 AM

## 2024-01-18 ENCOUNTER — Encounter: Payer: Self-pay | Admitting: Hematology and Oncology

## 2024-01-18 ENCOUNTER — Encounter: Payer: Self-pay | Admitting: *Deleted

## 2024-01-18 NOTE — Telephone Encounter (Signed)
 Patient is returning call.

## 2024-01-18 NOTE — Telephone Encounter (Signed)
   Name: Anna Cameron  DOB: Feb 07, 1976  MRN: [id number]  Primary Cardiologist: Redell Shallow, MD  Chart reviewed as part of pre-operative protocol coverage. Because of Anna Cameron's past medical history and time since last visit, she will require a follow-up in-office visit in order to better assess preoperative cardiovascular risk.  Patient recently took herself off of all of her medications and needs to be seen in follow-up for further evaluation.  Pre-op covering staff: - Please schedule appointment and call patient to inform them. If patient already had an upcoming appointment within acceptable timeframe, please add pre-op clearance to the appointment notes so provider is aware. - Please contact requesting surgeon's office via preferred method (i.e, phone, fax) to inform them of need for appointment prior to surgery.   Wyn Raddle, Jackee Shove, NP  01/18/2024, 7:42 AM

## 2024-01-18 NOTE — Telephone Encounter (Signed)
Left message to call back to schedule in office appt.

## 2024-01-18 NOTE — Telephone Encounter (Signed)
 Pt has been scheduled in office appt 01/19/24 with Lum Louis, NP 1:30 pm. Pt has been given address for [facility].   I will update all parties involved.

## 2024-01-19 ENCOUNTER — Ambulatory Visit: Attending: Emergency Medicine | Admitting: Emergency Medicine

## 2024-01-19 ENCOUNTER — Encounter: Payer: Self-pay | Admitting: Hematology and Oncology

## 2024-01-19 ENCOUNTER — Encounter: Payer: Self-pay | Admitting: Emergency Medicine

## 2024-01-19 VITALS — BP 136/84 | HR 83 | Ht 67.0 in | Wt 175.0 lb

## 2024-01-19 DIAGNOSIS — I2510 Atherosclerotic heart disease of native coronary artery without angina pectoris: Secondary | ICD-10-CM

## 2024-01-19 DIAGNOSIS — D751 Secondary polycythemia: Secondary | ICD-10-CM

## 2024-01-19 DIAGNOSIS — Z01810 Encounter for preprocedural cardiovascular examination: Secondary | ICD-10-CM

## 2024-01-19 DIAGNOSIS — E782 Mixed hyperlipidemia: Secondary | ICD-10-CM | POA: Diagnosis not present

## 2024-01-19 DIAGNOSIS — I214 Non-ST elevation (NSTEMI) myocardial infarction: Secondary | ICD-10-CM

## 2024-01-19 DIAGNOSIS — Z720 Tobacco use: Secondary | ICD-10-CM

## 2024-01-19 MED ORDER — ROSUVASTATIN CALCIUM 40 MG PO TABS: 40.0000 mg | ORAL_TABLET | Freq: Every day | ORAL | 3 refills | Status: DC

## 2024-01-19 NOTE — Patient Instructions (Addendum)
 Medication Instructions:  START CRESTOR  20 MG DAILY.   Lab Work: CMET AND CBC TO BE DONE TODAY.   Testing/Procedures: NONE  Follow-Up: At [facility], you and your health needs are our priority.  As part of our continuing mission to provide you with exceptional heart care, our providers are all part of one team.  This team includes your primary Cardiologist (physician) and Advanced Practice Providers or APPs (Physician Assistants and Nurse Practitioners) who all work together to provide you with the care you need, when you need it.  Your next appointment:   3 MONTHS  Provider:   Redell Shallow, MD OR Lum Louis, NP     Other Instructions PLEASE ESTABLISH CARE WITH DR. LYNWOOD CRANDALL AT [facility] [phone]

## 2024-01-19 NOTE — Progress Notes (Signed)
 Cardiology Office Note:    Date:  01/19/2024  ID:  Anna Cameron, DOB September 15, 1975, MRN [id number] PCP: Joshua Cathryne BROCKS, MD (Inactive)  [name], [name], NP       Patient Profile:       Chief Complaint: Preoperative clearance History of Present Illness:  Anna Cameron is a 48 y.o. female with visit-pertinent history of coronary artery disease with NSTEMI in 06/2022 s/p DES to first diagonal, hyperlipidemia, erythrocytosis, anxiety, prior EtOH abuse, and alcohol abuse  Patient was seen by cardiology during admission for NSTEMI in 06/2022.  Cardiac catheterization showed 95% stenosis of the first diagonal and otherwise normal coronaries.  She underwent successful PCI with DES to the prostatic.  Echocardiogram showed LVEF of 60-65%, no RWMA, grade 1 DD, normal RV, and mild to moderate TR.  She was started on DAPT with aspirin  and Brilinta  as well as beta-blocker and high intensity statin.  She was last seen in office on 11/01/2018 for by Anna Cameron.  She had no recurrent chest pain.  Did report occasional shortness of breath when ambulating which occurs randomly.  Previously on metoprolol  but stopped due to fatigue.  Her atorvastatin  was discontinued and placed on rosuvastatin  40 mg daily as LDL was above goal.  She did continue to smoke.  She was to follow-up in 6 months.  She is now pending left knee ACL/meniscus surgery on 01/29/2024 with [facility].   Discussed the use of AI scribe software for clinical note transcription with the patient, who gave verbal consent to proceed.  History of Present Illness Anna Cameron is a 48 year old female with a history of myocardial infarction and stent placement who presents for follow-up and medication management.  Today patient is doing well overall.  She is without any acute cardiovascular concerns or complaints.  She initially took Brilinta  and  aspirin  but discontinued Brilinta  in September 2024 due to insurance issues after she left her job. She continues on daily baby aspirin . She reports no current chest pain, shortness of breath, or GERD symptoms and has not used nitroglycerin  since the myocardial infarction.  She denies any orthopnea, PND, syncope, presyncope, lightheadedness, dizziness.  Her prior anginal equivalent was heartburn.  Her last LDL cholesterol level was 74 mg/dL in March 7975. She was on rosuvastatin  but stopped due to insurance loss. She is concerned about potential cholesterol increases since discontinuing the medication.  She smokes approximately one and a half packs per day, which is associated with a chronic cough, especially at rest.  She is currently in PT due to her torn ACL and meniscus.  She is without any exertional symptoms during her PT classes.  Review of systems:  Please see the history of present illness. All other systems are reviewed and otherwise negative.      Studies Reviewed:    EKG Interpretation Date/Time:  Friday January 19 2024 13:43:36 EDT Ventricular Rate:  83 PR Interval:  158 QRS Duration:  70 QT Interval:  398 QTC Calculation: 467 R Axis:   57  Text Interpretation: Normal sinus rhythm Normal ECG When compared with ECG of 08-Jul-2022 07:05, T wave inversion no longer evident in Lateral leads Confirmed by Rana Lum [phone]) on 01/19/2024 2:18:29 PM    Cardiac catheterization 07/07/2022   1st Diag lesion is 95% stenosed.   A stent was successfully placed.   Post intervention, there is a 0% residual stenosis.  1.  High-grade first diagonal lesion treated with 1 drug-eluting stent. 2.  LVEDP of 14 mmHg.   Recommendation: Dual antiplatelet therapy for 1 year and optimal medical therapy for cardiovascular risk factors. Diagnostic Dominance: Right  Intervention    Echocardiogram 07/08/2022 1. Left ventricular ejection fraction, by estimation, is 60 to 65%. The  left  ventricle has normal function. The left ventricle has no regional  wall motion abnormalities. Left ventricular diastolic parameters are  consistent with Grade I diastolic  dysfunction (impaired relaxation).   2. Right ventricular systolic function is normal. The right ventricular  size is normal. There is normal pulmonary artery systolic pressure. The  estimated right ventricular systolic pressure is 24.9 mmHg.   3. Left atrial size was mildly dilated.   4. The mitral valve is normal in structure. No evidence of mitral valve  regurgitation. No evidence of mitral stenosis.   5. Tricuspid valve regurgitation is mild to moderate.   6. The aortic valve is normal in structure. Aortic valve regurgitation is  not visualized. No aortic stenosis is present.   7. The inferior vena cava is normal in size with greater than 50%  respiratory variability, suggesting right atrial pressure of 3 mmHg.   Risk Assessment/Calculations:              Physical Exam:   VS:  BP 136/84 (BP Location: Right Arm, Patient Position: Sitting, Cuff Size: Normal)   Pulse 83   Ht 5' 7 (1.702 m)   Wt 175 lb (79.4 kg)   BMI 27.41 kg/m    Wt Readings from Last 3 Encounters:  01/19/24 175 lb (79.4 kg)  11/27/23 185 lb (83.9 kg)  11/01/22 202 lb (91.6 kg)    GEN: Well nourished, well developed in no acute distress NECK: No JVD; No carotid bruits CARDIAC: RRR, no murmurs, rubs, gallops RESPIRATORY:  Clear to auscultation without rales, wheezing or rhonchi  ABDOMEN: Soft, non-tender, non-distended EXTREMITIES:  No edema; No acute deformity      Assessment and Plan:  Coronary artery disease NSTEMI 06/2022 s/p DES to first diagonal Echocardiogram 06/2022 LVEF 60 to 65% EKG today without acute ischemic changes - Today patient is without anginal symptoms, no indication further ischemic evaluation at this time.  She is active during PT sessions without exertional symptoms - Was lost to follow-up and discontinued her  Brilinta  on 01/2023 after she lost her insurance.  She did continue her aspirin  daily - Continue aspirin  81 mg daily - Restart rosuvastatin  20 mg daily - CMET and CBC today  Hyperlipidemia, LDL goal <70 LDL 74 on 07/2022 Reports she has been off of rosuvastatin  since 01/2023 after she lost her insurance - Plan to restart rosuvastatin  20 mg a day and repeat fasting lipid panel at follow-up visit  [name] Patient with history of erythrocytosis that was previously treated with phlebotomy - Hemoglobin 14.8 on 08/2022 - Tobacco cessation encouraged - CBC today  Tobacco abuse Continues to smoke a pack and a half a day - Tobacco cessation encouraged  Preoperative cardiovascular clearance Pending left knee ACL/meniscus surgery on 01/29/2024 with [facility] on 01/29/2024  According to the Revised Cardiac Risk Index (RCRI), her Perioperative Risk of Major Cardiac Event is (%): 0.9. Her Functional Capacity in METs is: 6.7 according to the Duke Activity Status Index (DASI). Therefore, based on ACC/AHA guidelines, patient would be at acceptable risk for the planned procedure without further cardiovascular testing. I will route this recommendation to the requesting party via  Epic fax function.  She may hold aspirin  for 5-7 days prior to procedure. Please resume aspirin  as soon as possible postprocedure, at the discretion of the surgeon.         Dispo:  Return in about 3 months (around 04/20/2024).  Signed, Lum LITTIE Louis, NP

## 2024-01-20 ENCOUNTER — Ambulatory Visit: Payer: Self-pay | Admitting: Emergency Medicine

## 2024-01-20 LAB — COMPREHENSIVE METABOLIC PANEL WITH GFR
ALT: 38 IU/L — ABNORMAL HIGH (ref 0–32)
AST: 28 IU/L (ref 0–40)
Albumin: 4.7 g/dL (ref 3.9–4.9)
Alkaline Phosphatase: 125 IU/L — ABNORMAL HIGH (ref 44–121)
BUN/Creatinine Ratio: 25 — ABNORMAL HIGH (ref 9–23)
BUN: 20 mg/dL (ref 6–24)
Bilirubin Total: <0.2 mg/dL (ref 0.0–1.2)
CO2: 22 mmol/L (ref 20–29)
Calcium: 10 mg/dL (ref 8.7–10.2)
Chloride: 105 mmol/L (ref 96–106)
Creatinine, Ser: 0.79 mg/dL (ref 0.57–1.00)
Globulin, Total: 2 g/dL (ref 1.5–4.5)
Glucose: 78 mg/dL (ref 70–99)
Potassium: 4.8 mmol/L (ref 3.5–5.2)
Sodium: 142 mmol/L (ref 134–144)
Total Protein: 6.7 g/dL (ref 6.0–8.5)
eGFR: 93 mL/min/1.73 (ref >59)

## 2024-01-20 LAB — CBC
Hematocrit: 48 % — ABNORMAL HIGH (ref 34.0–46.6)
Hemoglobin: 16.1 g/dL — ABNORMAL HIGH (ref 11.1–15.9)
MCH: 32.3 pg (ref 26.6–33.0)
MCHC: 33.5 g/dL (ref 31.5–35.7)
MCV: 96 fL (ref 79–97)
Platelets: 361 x10E3/uL (ref 150–450)
RBC: 4.99 x10E6/uL (ref 3.77–5.28)
RDW: 12.6 % (ref 11.7–15.4)
WBC: 10.1 x10E3/uL (ref 3.4–10.8)

## 2024-03-17 ENCOUNTER — Other Ambulatory Visit: Payer: Self-pay | Admitting: Medical Genetics

## 2024-03-17 DIAGNOSIS — Z006 Encounter for examination for normal comparison and control in clinical research program: Secondary | ICD-10-CM

## 2024-03-18 ENCOUNTER — Encounter: Payer: Self-pay | Admitting: Hematology and Oncology

## 2024-03-22 ENCOUNTER — Other Ambulatory Visit (HOSPITAL_COMMUNITY): Payer: Self-pay

## 2024-04-05 ENCOUNTER — Encounter: Payer: Self-pay | Admitting: *Deleted

## 2024-04-09 ENCOUNTER — Ambulatory Visit: Attending: Emergency Medicine | Admitting: Emergency Medicine

## 2024-04-09 ENCOUNTER — Encounter: Payer: Self-pay | Admitting: Emergency Medicine

## 2024-04-09 VITALS — BP 122/84 | HR 68 | Ht 67.0 in | Wt 180.0 lb

## 2024-04-09 DIAGNOSIS — E785 Hyperlipidemia, unspecified: Secondary | ICD-10-CM | POA: Insufficient documentation

## 2024-04-09 DIAGNOSIS — Z79899 Other long term (current) drug therapy: Secondary | ICD-10-CM | POA: Insufficient documentation

## 2024-04-09 DIAGNOSIS — I2510 Atherosclerotic heart disease of native coronary artery without angina pectoris: Secondary | ICD-10-CM | POA: Insufficient documentation

## 2024-04-09 DIAGNOSIS — Z720 Tobacco use: Secondary | ICD-10-CM | POA: Insufficient documentation

## 2024-04-09 DIAGNOSIS — D751 Secondary polycythemia: Secondary | ICD-10-CM | POA: Diagnosis present

## 2024-04-09 LAB — CBC

## 2024-04-09 NOTE — Progress Notes (Signed)
 Cardiology Office Note:    Date:  04/09/2024  ID:  Anna Cameron, DOB 07-03-1975, MRN [id number] PCP: No primary care provider on file.  [facility] Providers Cardiologist:  Redell Shallow, [name], NP       Patient Profile:       Chief Complaint: 17-month follow-up History of Present Illness:  Anna Cameron is a 48 y.o. female with visit-pertinent history of coronary artery disease with NSTEMI in 06/2022 s/p DES to first diagonal, hyperlipidemia, erythrocytosis, anxiety, prior EtOH abuse, and alcohol abuse   Patient was seen by cardiology during admission for NSTEMI in 06/2022.  Cardiac catheterization showed 95% stenosis of the first diagonal and otherwise normal coronaries.  She underwent successful PCI with DES to the prostatic.  Echocardiogram showed LVEF of 60-65%, no RWMA, grade 1 DD, normal RV, and mild to moderate TR.  She was started on DAPT with aspirin  and Brilinta  as well as beta-blocker and high intensity statin.   Seen in office on 11/01/2022 by [place], PA.  She had no recurrent chest pain.  Did report occasional shortness of breath when ambulating which occurs randomly.  Previously on metoprolol  but stopped due to fatigue.  Her atorvastatin  was discontinued and placed on rosuvastatin  40 mg daily as LDL was above goal.  She did continue to smoke.  She was to follow-up in 6 months.   Last seen in clinic on 01/19/2024.  She had self discontinued Brilinta  and rosuvastatin  in September 2024 due to insurance issues after she left her job.  She was smoking approximately 1-1/2 packs/day.  She was restarted on rosuvastatin  20 mg a day.  She was given clearance for upcoming left knee ACL/meniscus surgery.   Discussed the use of AI scribe software for clinical note transcription with the patient, who gave verbal consent to proceed.  History of Present Illness Anna Cameron is a 48 year old female who presents for follow-up after knee surgery and  to discuss lifestyle modifications.  Today she is doing well without any acute cardiovascular concerns.  She denies chest pain, shortness of breath, orthopnea, PND, or leg swelling.  She is without any exertional symptoms.  She smokes nearly two packs of cigarettes daily and is attempting to reduce smoking. She consumes a lot of soda and occasionally energy drinks. She does not consume alcohol but takes ibuprofen and BC powder for headaches.  She denies syncope, presyncope, palpitations, lightheadedness, dizziness   Review of systems:  Please see the history of present illness. All other systems are reviewed and otherwise negative.      Studies Reviewed:        Cardiac catheterization 07/07/2022   1st Diag lesion is 95% stenosed.   A stent was successfully placed.   Post intervention, there is a 0% residual stenosis.   1.  High-grade first diagonal lesion treated with 1 drug-eluting stent. 2.  LVEDP of 14 mmHg.   Recommendation: Dual antiplatelet therapy for 1 year and optimal medical therapy for cardiovascular risk factors. Diagnostic Dominance: Right  Intervention      Echocardiogram 07/08/2022 1. Left ventricular ejection fraction, by estimation, is 60 to 65%. The  left ventricle has normal function. The left ventricle has no regional  wall motion abnormalities. Left ventricular diastolic parameters are  consistent with Grade I diastolic  dysfunction (impaired relaxation).   2. Right ventricular systolic function is normal. The right ventricular  size is normal. There is normal pulmonary artery systolic pressure.  The  estimated right ventricular systolic pressure is 24.9 mmHg.   3. Left atrial size was mildly dilated.   4. The mitral valve is normal in structure. No evidence of mitral valve  regurgitation. No evidence of mitral stenosis.   5. Tricuspid valve regurgitation is mild to moderate.   6. The aortic valve is normal in structure. Aortic valve regurgitation is   not visualized. No aortic stenosis is present.   7. The inferior vena cava is normal in size with greater than 50%  respiratory variability, suggesting right atrial pressure of 3 mmHg.   Risk Assessment/Calculations:              Physical Exam:   VS:  BP 122/84 (BP Location: Left Arm, Patient Position: Sitting, Cuff Size: Normal)   Pulse 68   Ht 5' 7 (1.702 m)   Wt 180 lb (81.6 kg)   SpO2 99%   BMI 28.19 kg/m    Wt Readings from Last 3 Encounters:  04/09/24 180 lb (81.6 kg)  01/19/24 175 lb (79.4 kg)  11/27/23 185 lb (83.9 kg)    GEN: Well nourished, well developed in no acute distress NECK: No JVD; No carotid bruits CARDIAC: RRR, no murmurs, rubs, gallops RESPIRATORY:  Clear to auscultation without rales, wheezing or rhonchi  ABDOMEN: Soft, non-tender, non-distended EXTREMITIES:  No edema; No acute deformity      Assessment and Plan:  Coronary artery disease NSTEMI 06/2022 s/p DES to first diagonal Echocardiogram 06/2022 LVEF 60 to 65% - Today she is stable without chest pains.  No symptoms to suggest active angina.  No indication for further ischemic evaluation at this time - Was lost to follow-up and self-discontinued her Brilinta  on 01/2023 after she lost her insurance.  She did continue her aspirin  daily - She is laying taking BC powders and ibuprofen for headaches.  I have recommended avoiding both and taking Tylenol  instead.  I have asked her to limit these and follow-up with PCP to discuss alternative for prevention - Continue aspirin  81 mg daily - Continue rosuvastatin  20 mg daily - CMET today   Hyperlipidemia, LDL goal <70 LDL 74 on 07/2022 Was restarted on statin lowering therapy on 12/2023 after being off since 01/2023 - Continue rosuvastatin  20 mg daily - Plan for fasting lipid panel and LFTs today   [name] Patient with history of erythrocytosis that was previously treated with phlebotomy - Hemoglobin 16.1 on 8/29 - I have asked her to reestablish  with a primary care provider - Tobacco cessation encouraged - CBC today   Tobacco abuse Currently smoking about 2 packs a day - Tobacco cessation encouraged      Dispo:  Return in about 9 months (around 01/07/2025).  Signed, Lum LITTIE Louis, NP

## 2024-04-09 NOTE — Patient Instructions (Signed)
 Medication Instructions:  NO CHANGES  Lab Work: CMET, CBC, FASTING LIPID PANEL, AND A1C TO BE DONE TODAY.  Testing/Procedures: NONE  Follow-Up: At [facility], you and your health needs are our priority.  As part of our continuing mission to provide you with exceptional heart care, our providers are all part of one team.  This team includes your primary Cardiologist (physician) and Advanced Practice Providers or APPs (Physician Assistants and Nurse Practitioners) who all work together to provide you with the care you need, when you need it.  Your next appointment:   6-9 MONTHS  Provider:   Redell Shallow, MD OR Lum Louis, NP

## 2024-04-10 ENCOUNTER — Ambulatory Visit: Payer: Self-pay | Admitting: Emergency Medicine

## 2024-04-10 DIAGNOSIS — Z79899 Other long term (current) drug therapy: Secondary | ICD-10-CM

## 2024-04-10 LAB — CBC
Hematocrit: 46.2 % (ref 34.0–46.6)
Hemoglobin: 15.5 g/dL (ref 11.1–15.9)
MCH: 32.3 pg (ref 26.6–33.0)
MCHC: 33.5 g/dL (ref 31.5–35.7)
MCV: 96 fL (ref 79–97)
Platelets: 312 x10E3/uL (ref 150–450)
RBC: 4.8 x10E6/uL (ref 3.77–5.28)
RDW: 12.3 % (ref 11.7–15.4)
WBC: 8 x10E3/uL (ref 3.4–10.8)

## 2024-04-10 LAB — LIPID PANEL
Chol/HDL Ratio: 2.9 ratio (ref 0.0–4.4)
Cholesterol, Total: 159 mg/dL (ref 100–199)
HDL: 54 mg/dL (ref >39)
LDL Chol Calc (NIH): 93 mg/dL (ref 0–99)
Triglycerides: 60 mg/dL (ref 0–149)
VLDL Cholesterol Cal: 12 mg/dL (ref 5–40)

## 2024-04-10 LAB — COMPREHENSIVE METABOLIC PANEL WITH GFR
ALT: 18 IU/L (ref 0–32)
AST: 18 IU/L (ref 0–40)
Albumin: 4.6 g/dL (ref 3.9–4.9)
Alkaline Phosphatase: 92 IU/L (ref 41–116)
BUN/Creatinine Ratio: 31 — ABNORMAL HIGH (ref 9–23)
BUN: 23 mg/dL (ref 6–24)
Bilirubin Total: <0.2 mg/dL (ref 0.0–1.2)
CO2: 23 mmol/L (ref 20–29)
Calcium: 9.7 mg/dL (ref 8.7–10.2)
Chloride: 105 mmol/L (ref 96–106)
Creatinine, Ser: 0.75 mg/dL (ref 0.57–1.00)
Globulin, Total: 2 g/dL (ref 1.5–4.5)
Glucose: 74 mg/dL (ref 70–99)
Potassium: 4.5 mmol/L (ref 3.5–5.2)
Sodium: 142 mmol/L (ref 134–144)
Total Protein: 6.6 g/dL (ref 6.0–8.5)
eGFR: 99 mL/min/1.73 (ref >59)

## 2024-04-10 LAB — HEMOGLOBIN A1C
Est. average glucose Bld gHb Est-mCnc: 108 mg/dL
Hgb A1c MFr Bld: 5.4 % (ref 4.8–5.6)

## 2024-04-10 MED ORDER — ROSUVASTATIN CALCIUM 40 MG PO TABS: 80.0000 mg | ORAL_TABLET | Freq: Every day | ORAL | 3 refills | Status: AC

## 2024-04-10 NOTE — Telephone Encounter (Signed)
 Pt said she was returning a call to nurse [employer]

## 2024-06-25 ENCOUNTER — Encounter: Payer: Self-pay | Admitting: Hematology and Oncology

## 2024-06-25 ENCOUNTER — Ambulatory Visit
Admission: EM | Admit: 2024-06-25 | Discharge: 2024-06-25 | Disposition: A | Discharge status: Home / Self Care | Source: Home / Self Care | Attending: Family Medicine | Admitting: Family Medicine

## 2024-06-25 DIAGNOSIS — L259 Unspecified contact dermatitis, unspecified cause: Secondary | ICD-10-CM

## 2024-06-25 DIAGNOSIS — F17210 Nicotine dependence, cigarettes, uncomplicated: Secondary | ICD-10-CM

## 2024-06-25 DIAGNOSIS — J209 Acute bronchitis, unspecified: Secondary | ICD-10-CM

## 2024-06-25 DIAGNOSIS — R0981 Nasal congestion: Secondary | ICD-10-CM

## 2024-06-25 MED ORDER — HYDROCOD POLI-CHLORPHE POLI ER 10-8 MG/5ML PO SUER: 5.0000 mL | Freq: Two times a day (BID) | ORAL | 0 refills | Status: AC | PRN

## 2024-06-25 MED ORDER — DEXAMETHASONE SOD PHOSPHATE PF 10 MG/ML IJ SOLN
10.0000 mg | Freq: Once | INTRAMUSCULAR | Status: AC
  Administered 2024-06-25: 10 mg via INTRAMUSCULAR

## 2024-06-25 MED ORDER — PREDNISONE 10 MG (21) PO TBPK: ORAL_TABLET | Freq: Every day | ORAL | 0 refills | Status: AC

## 2024-06-25 MED ORDER — AZITHROMYCIN 250 MG PO TABS: ORAL_TABLET | ORAL | 0 refills | Status: AC

## 2024-06-25 NOTE — Discharge Instructions (Addendum)
 Take your antibiotics for your respiratory infection. See handout on contact dermaitis. Avoid Mucinex-Fast Max.  Start your prednisone  tablets tomorrow as you were given a shot of steroids today.   Stop by the pharmacy to pick up your prescriptions.  Follow up with your primary care provider or return to the urgent care, if not improving.

## 2024-06-25 NOTE — ED Notes (Signed)
 Patient given dexamethasone  IM requested deltoid left. Well tolerated. Educated on fall risk with Tussonex, maintain hydration, yogurt or probiotic while taking Abx,and getting rest. AVS reviewed with patient all questions answered.

## 2024-06-25 NOTE — ED Provider Notes (Signed)
 " [facility]    CSN: [id number] Arrival date & time: 06/25/24  1659      History   Chief Complaint Chief Complaint  Patient presents with   Cough   Rash    HPI Anna Cameron is a 49 y.o. female.   HPI  History obtained from the patient. Anna Cameron presents for nasal congestion got worse for the past 2 days but has had cough and chest congestion for the past month.  She has coughed to the point her chest hurts.   She broke out in a rash 2 days ago.  Took Benadryl  and apply some hydrocortisone cream but it didn't help. The rash is itchy. The itching doesn't change day vs night. The rash started on her abdomen and spread upwards and to top of her thigh.    She smokes cigarettes.  No history of asthma or COPD.      Past Medical History:  Diagnosis Date   Allergy    Anxiety    CAD (coronary artery disease)    Depression    [name]    Hyperlipidemia    NSTEMI (non-ST elevated myocardial infarction) (HCC)    s/p DES to 1st Diag in 06/2022   Tobacco abuse     Patient Active Problem List   Diagnosis Date Noted   CAD (coronary artery disease) 07/08/2022   Elevated BP without diagnosis of hypertension 07/08/2022   Hyperlipidemia 07/08/2022   NSTEMI (non-ST elevated myocardial infarction) (HCC) 07/06/2022   MDD (major depressive disorder), recurrent episode, mild 09/14/2021   Insomnia 09/14/2021   Tobacco abuse 09/14/2021   Alcohol use disorder, moderate, in sustained remission (HCC) 09/14/2021   H/O total hysterectomy 07/14/2020   [name] 02/19/2020   Weight loss, unintentional 09/22/2016    Past Surgical History:  Procedure Laterality Date   ABDOMINAL HYSTERECTOMY     bladder tact     BOTOX  INJECTION     CESAREAN SECTION     x 3   CORONARY STENT INTERVENTION N/A 07/07/2022   Procedure: CORONARY STENT INTERVENTION;  Surgeon: Thukkani, Anna K, MD;  Location: [facility];  Service: Cardiovascular;  Laterality: N/A;   CYSTOSCOPY WITH  INJECTION N/A 09/05/2019   Procedure: CYSTOSCOPY WITH INJECTION BLADDER BOTOX ;  Surgeon: Anna Anna SAUNDERS, MD;  Location: [facility];  Service: Urology;  Laterality: N/A;   LEFT HEART CATH AND CORONARY ANGIOGRAPHY N/A 07/07/2022   Procedure: LEFT HEART CATH AND CORONARY ANGIOGRAPHY;  Surgeon: Anna Anna POUR, MD;  Location: [facility];  Service: Cardiovascular;  Laterality: N/A;    OB History   No obstetric history on file.      Home Medications    Prior to Admission medications  Medication Sig Start Date End Date Taking? Authorizing Provider  azithromycin  (ZITHROMAX  Z-PAK) 250 MG tablet Take 2 tablets on day 1 then 1 tablet daily 06/25/24  Yes Anna Barnier, DO  chlorpheniramine-HYDROcodone (TUSSIONEX) 10-8 MG/5ML Take 5 mLs by mouth every 12 (twelve) hours as needed. 06/25/24  Yes Anna Backlund, DO  predniSONE  (STERAPRED UNI-PAK 21 TAB) 10 MG (21) TBPK tablet Take by mouth daily. Take 6 tabs by mouth daily for 1, then 5 tabs for 1 day, then 4 tabs for 1 day, then 3 tabs for 1 day, then 2 tabs for 1 day, then 1 tab for 1 day. 06/25/24  Yes Anna Guin, DO  rosuvastatin  (CRESTOR ) 40 MG tablet Take 2 tablets (80 mg total) by mouth daily. 04/10/24 07/09/24 Yes Anna Cameron, Anna  L, NP  aspirin  81 MG chewable tablet Chew 1 tablet (81 mg total) by mouth daily. 07/08/22   Anna Cameron, Anna E, PA-C  Multiple Vitamin (MULTIVITAMIN WITH MINERALS) TABS tablet Take 1 tablet by mouth daily with breakfast.    [provider]  nicotine  (NICODERM CQ  - DOSED IN MG/24 HOURS) 21 mg/24hr patch Place 1 patch (21 mg total) onto the skin daily. 07/27/22   Anna Cameron, Anna E, PA-C  nitroGLYCERIN  (NITROSTAT ) 0.4 MG SL tablet Place 1 tablet (0.4 mg total) under the tongue every 5 (five) minutes as needed for chest pain. 07/08/22   Anna Cameron, Anna E, PA-C  Vibegron  (GEMTESA ) 75 MG TABS Take 1 tablet (75 mg total) by mouth daily. Patient not taking: Reported on 06/25/2024 10/14/22   Anna Hamilton, MD   vitamin B-12 (CYANOCOBALAMIN) 1000 MCG tablet Take 1,000 mcg by mouth in the morning.    [provider]    Family History Family History  Problem Relation Age of Onset   Cancer Father    Schizophrenia Brother    Cancer Maternal Grandmother     Social History Social History[1]   Allergies   Mucinex fast-max   Review of Systems Review of Systems: negative unless otherwise stated in HPI.      Physical Exam Triage Vital Signs ED Triage Vitals  Encounter Vitals Group     BP 06/25/24 1742 133/88     Girls Systolic BP Percentile --      Girls Diastolic BP Percentile --      Boys Systolic BP Percentile --      Boys Diastolic BP Percentile --      Pulse Rate 06/25/24 1742 100     Resp 06/25/24 1742 19     Temp 06/25/24 1742 99.1 F (37.3 C)     Temp src --      SpO2 06/25/24 1742 96 %     Weight 06/25/24 1740 176 lb (79.8 kg)     Height --      Head Circumference --      Peak Flow --      Pain Score 06/25/24 1740 0     Pain Loc --      Pain Education --      Exclude from Growth Chart --    No data found.  Updated Vital Signs BP 133/88   Pulse 100   Temp 99.1 F (37.3 C)   Resp 19   Wt 79.8 kg   SpO2 96%   BMI 27.57 kg/m   Visual Acuity Right Eye Distance:   Left Eye Distance:   Bilateral Distance:    Right Eye Near:   Left Eye Near:    Bilateral Near:     Physical Exam GEN:     alert, non-toxic appearing female in no distress    HENT:  mucus membranes moist, oropharyngeal without lesions or erythema, clear nasal discharge EYES:   pupils equal and reactive, no scleral injection or discharge RESP:  no increased work of breathing, rhonchi bilaterally CVS:   regular rate and rhythm Skin:   warm and dry, erythematous patches on bilateral upper extremities, back, abdomen and chest    UC Treatments / Results  Labs (all labs ordered are listed, but only abnormal results are displayed) Labs Reviewed - No data to  display  EKG   Radiology No results found.    Procedures Procedures (including critical care time)  Medications Ordered in UC Medications  dexamethasone  (DECADRON ) injection 10 mg (10  mg Intramuscular Given 06/25/24 1827)    Initial Impression / Assessment and Plan / UC Course  I have reviewed the triage vital signs and the nursing notes.  Pertinent labs & imaging results that were available during my care of the patient were reviewed by me and considered in my medical decision making (see chart for details).       Pt is a 49 y.o. female who presents for 4 weeks of cough that is not improving.  Malaika is  afebrile here without recent antipyretics. Satting well on room air. Overall pt is  non-toxic appearing, well hydrated, without respiratory distress. Pulmonary exam is remarkable for rhonchi that clear with cough.  After shared decision making, we will not pursue chest x-ray at this time.  COVID  and influenza testing deferred due to length of symptoms.   Treat acute bronchitis in a cigarette smoker with steroids and antibiotics as below.  Tussionex cough syrup given for cough and allow patient to rest.  Typical duration of symptoms discussed.   Rash concerning for contact dermatitis.  Discussed p.o. versus IM steroids and when to use them.  Patient agreeable to IM steroids here.  Prednisone  also prescribed.   Return and ED precautions given and patient voiced understanding.   Discussed MDM, treatment plan and plan for follow-up with patient who agrees with plan.      Final Clinical Impressions(s) / UC Diagnoses   Final diagnoses:  Contact dermatitis, unspecified contact dermatitis type, unspecified trigger  Acute bronchitis, unspecified organism  Cigarette smoker  Nasal congestion     Discharge Instructions      Take your antibiotics for your respiratory infection. See handout on contact dermaitis. Avoid Mucinex-Fast Max.  Start your prednisone  tablets tomorrow  as you were given a shot of steroids today.   Stop by the pharmacy to pick up your prescriptions.  Follow up with your primary care provider or return to the urgent care, if not improving.        ED Prescriptions     Medication Sig Dispense Auth. Provider   predniSONE  (STERAPRED UNI-PAK 21 TAB) 10 MG (21) TBPK tablet Take by mouth daily. Take 6 tabs by mouth daily for 1, then 5 tabs for 1 day, then 4 tabs for 1 day, then 3 tabs for 1 day, then 2 tabs for 1 day, then 1 tab for 1 day. 21 tablet Caydin Yeatts, DO   chlorpheniramine-HYDROcodone (TUSSIONEX) 10-8 MG/5ML Take 5 mLs by mouth every 12 (twelve) hours as needed. 115 mL Bonnie Roig, DO   azithromycin  (ZITHROMAX  Z-PAK) 250 MG tablet Take 2 tablets on day 1 then 1 tablet daily 6 tablet Anysha Frappier, DO      I have reviewed the PDMP during this encounter.     [1]  Social History Tobacco Use   Smoking status: Every Day    Current packs/day: 1.50    Average packs/day: 1.5 packs/day for 29.0 years (43.5 ttl pk-yrs)    Types: Cigarettes   Smokeless tobacco: Never  Vaping Use   Vaping status: Never Used  Substance Use Topics   Alcohol use: Not Currently   Drug use: Never     Kriste Berth, DO 06/28/24 1432  "

## 2024-06-25 NOTE — ED Triage Notes (Signed)
 Patient to Urgent Care with complaints of rash to entire body (red and very itchy)/ cough/ nasal congestion/ runny nose.   Rash started two days ago. Reports cough and sinus symptoms started in November.   Using mucinex-Dm believes this could have caused the rash. Benadryl  with no change.
